# Patient Record
Sex: Female | Born: 1965 | Race: White | Hispanic: No | Marital: Single | Smoking: Former smoker
Health system: Southern US, Community
[De-identification: ages and names within clinical notes are randomized; demographics above are authoritative.]

## PROBLEM LIST (undated history)

## (undated) DIAGNOSIS — G43909 Migraine, unspecified, not intractable, without status migrainosus: Secondary | ICD-10-CM

## (undated) DIAGNOSIS — F419 Anxiety disorder, unspecified: Secondary | ICD-10-CM

## (undated) HISTORY — PX: COLON RESECTION: SHX5231

## (undated) HISTORY — PX: RETINAL DETACHMENT SURGERY: SHX105

## (undated) HISTORY — PX: ABDOMINAL SURGERY: SHX537

## (undated) HISTORY — DX: Migraine, unspecified, not intractable, without status migrainosus: G43.909

## (undated) HISTORY — PX: EYE SURGERY: SHX253

## (undated) HISTORY — DX: Anxiety disorder, unspecified: F41.9

## (undated) HISTORY — PX: CATARACT EXTRACTION, BILATERAL: SHX1313

---

## 1991-11-23 DIAGNOSIS — G43909 Migraine, unspecified, not intractable, without status migrainosus: Secondary | ICD-10-CM | POA: Insufficient documentation

## 2003-09-23 DIAGNOSIS — Z780 Asymptomatic menopausal state: Secondary | ICD-10-CM | POA: Insufficient documentation

## 2018-07-30 DIAGNOSIS — G4700 Insomnia, unspecified: Secondary | ICD-10-CM | POA: Diagnosis not present

## 2018-07-30 DIAGNOSIS — F4321 Adjustment disorder with depressed mood: Secondary | ICD-10-CM | POA: Diagnosis not present

## 2018-08-07 DIAGNOSIS — F410 Panic disorder [episodic paroxysmal anxiety] without agoraphobia: Secondary | ICD-10-CM | POA: Diagnosis not present

## 2018-08-07 DIAGNOSIS — F411 Generalized anxiety disorder: Secondary | ICD-10-CM | POA: Diagnosis not present

## 2018-09-18 DIAGNOSIS — J342 Deviated nasal septum: Secondary | ICD-10-CM | POA: Insufficient documentation

## 2018-09-18 DIAGNOSIS — J3489 Other specified disorders of nose and nasal sinuses: Secondary | ICD-10-CM | POA: Diagnosis not present

## 2018-09-18 DIAGNOSIS — J310 Chronic rhinitis: Secondary | ICD-10-CM | POA: Diagnosis not present

## 2018-09-18 DIAGNOSIS — T485X5A Adverse effect of other anti-common-cold drugs, initial encounter: Secondary | ICD-10-CM | POA: Diagnosis not present

## 2018-09-18 DIAGNOSIS — M950 Acquired deformity of nose: Secondary | ICD-10-CM | POA: Insufficient documentation

## 2018-09-24 DIAGNOSIS — W57XXXA Bitten or stung by nonvenomous insect and other nonvenomous arthropods, initial encounter: Secondary | ICD-10-CM | POA: Diagnosis not present

## 2018-09-24 DIAGNOSIS — S30860A Insect bite (nonvenomous) of lower back and pelvis, initial encounter: Secondary | ICD-10-CM | POA: Diagnosis not present

## 2018-10-09 DIAGNOSIS — G43909 Migraine, unspecified, not intractable, without status migrainosus: Secondary | ICD-10-CM | POA: Diagnosis not present

## 2018-10-21 DIAGNOSIS — K645 Perianal venous thrombosis: Secondary | ICD-10-CM | POA: Diagnosis not present

## 2018-10-25 DIAGNOSIS — F411 Generalized anxiety disorder: Secondary | ICD-10-CM | POA: Diagnosis not present

## 2018-10-25 DIAGNOSIS — F410 Panic disorder [episodic paroxysmal anxiety] without agoraphobia: Secondary | ICD-10-CM | POA: Diagnosis not present

## 2018-12-27 DIAGNOSIS — R197 Diarrhea, unspecified: Secondary | ICD-10-CM | POA: Diagnosis not present

## 2018-12-27 DIAGNOSIS — A0472 Enterocolitis due to Clostridium difficile, not specified as recurrent: Secondary | ICD-10-CM | POA: Diagnosis not present

## 2018-12-27 DIAGNOSIS — R109 Unspecified abdominal pain: Secondary | ICD-10-CM | POA: Diagnosis not present

## 2018-12-27 DIAGNOSIS — Z6822 Body mass index (BMI) 22.0-22.9, adult: Secondary | ICD-10-CM | POA: Diagnosis not present

## 2018-12-27 DIAGNOSIS — A044 Other intestinal Escherichia coli infections: Secondary | ICD-10-CM | POA: Diagnosis not present

## 2018-12-30 DIAGNOSIS — A044 Other intestinal Escherichia coli infections: Secondary | ICD-10-CM | POA: Diagnosis not present

## 2018-12-30 DIAGNOSIS — R197 Diarrhea, unspecified: Secondary | ICD-10-CM | POA: Diagnosis not present

## 2018-12-30 DIAGNOSIS — R109 Unspecified abdominal pain: Secondary | ICD-10-CM | POA: Diagnosis not present

## 2018-12-30 DIAGNOSIS — A0472 Enterocolitis due to Clostridium difficile, not specified as recurrent: Secondary | ICD-10-CM | POA: Diagnosis not present

## 2019-01-07 DIAGNOSIS — M25511 Pain in right shoulder: Secondary | ICD-10-CM | POA: Diagnosis not present

## 2019-01-07 DIAGNOSIS — R748 Abnormal levels of other serum enzymes: Secondary | ICD-10-CM | POA: Diagnosis not present

## 2019-01-07 DIAGNOSIS — Z6822 Body mass index (BMI) 22.0-22.9, adult: Secondary | ICD-10-CM | POA: Diagnosis not present

## 2019-01-07 DIAGNOSIS — Z23 Encounter for immunization: Secondary | ICD-10-CM | POA: Diagnosis not present

## 2019-01-09 DIAGNOSIS — M25511 Pain in right shoulder: Secondary | ICD-10-CM | POA: Diagnosis not present

## 2019-01-09 DIAGNOSIS — M6281 Muscle weakness (generalized): Secondary | ICD-10-CM | POA: Diagnosis not present

## 2019-01-22 DIAGNOSIS — M6281 Muscle weakness (generalized): Secondary | ICD-10-CM | POA: Diagnosis not present

## 2019-01-22 DIAGNOSIS — M25511 Pain in right shoulder: Secondary | ICD-10-CM | POA: Diagnosis not present

## 2019-01-23 DIAGNOSIS — M7551 Bursitis of right shoulder: Secondary | ICD-10-CM | POA: Diagnosis not present

## 2019-02-07 DIAGNOSIS — F4312 Post-traumatic stress disorder, chronic: Secondary | ICD-10-CM | POA: Diagnosis not present

## 2019-02-07 DIAGNOSIS — F410 Panic disorder [episodic paroxysmal anxiety] without agoraphobia: Secondary | ICD-10-CM | POA: Diagnosis not present

## 2019-02-07 DIAGNOSIS — F332 Major depressive disorder, recurrent severe without psychotic features: Secondary | ICD-10-CM | POA: Diagnosis not present

## 2019-02-07 DIAGNOSIS — F411 Generalized anxiety disorder: Secondary | ICD-10-CM | POA: Diagnosis not present

## 2019-02-13 DIAGNOSIS — M545 Low back pain: Secondary | ICD-10-CM | POA: Diagnosis not present

## 2019-02-13 DIAGNOSIS — M7541 Impingement syndrome of right shoulder: Secondary | ICD-10-CM | POA: Diagnosis not present

## 2019-04-17 DIAGNOSIS — R509 Fever, unspecified: Secondary | ICD-10-CM | POA: Diagnosis not present

## 2019-04-17 DIAGNOSIS — R05 Cough: Secondary | ICD-10-CM | POA: Diagnosis not present

## 2019-04-17 DIAGNOSIS — Z20828 Contact with and (suspected) exposure to other viral communicable diseases: Secondary | ICD-10-CM | POA: Diagnosis not present

## 2019-05-02 DIAGNOSIS — F411 Generalized anxiety disorder: Secondary | ICD-10-CM | POA: Diagnosis not present

## 2019-05-02 DIAGNOSIS — F410 Panic disorder [episodic paroxysmal anxiety] without agoraphobia: Secondary | ICD-10-CM | POA: Diagnosis not present

## 2019-05-21 DIAGNOSIS — R0989 Other specified symptoms and signs involving the circulatory and respiratory systems: Secondary | ICD-10-CM | POA: Diagnosis not present

## 2019-05-21 DIAGNOSIS — R05 Cough: Secondary | ICD-10-CM | POA: Diagnosis not present

## 2019-05-21 DIAGNOSIS — R0602 Shortness of breath: Secondary | ICD-10-CM | POA: Diagnosis not present

## 2019-05-21 DIAGNOSIS — R197 Diarrhea, unspecified: Secondary | ICD-10-CM | POA: Diagnosis not present

## 2019-07-22 DIAGNOSIS — F410 Panic disorder [episodic paroxysmal anxiety] without agoraphobia: Secondary | ICD-10-CM | POA: Diagnosis not present

## 2019-07-22 DIAGNOSIS — F411 Generalized anxiety disorder: Secondary | ICD-10-CM | POA: Diagnosis not present

## 2019-08-22 ENCOUNTER — Other Ambulatory Visit: Payer: Self-pay

## 2019-08-22 ENCOUNTER — Ambulatory Visit
Admission: EM | Admit: 2019-08-22 | Discharge: 2019-08-22 | Disposition: A | Discharge status: Home / Self Care | Payer: BC Managed Care – PPO

## 2019-08-22 DIAGNOSIS — M25511 Pain in right shoulder: Secondary | ICD-10-CM

## 2019-08-22 DIAGNOSIS — M5441 Lumbago with sciatica, right side: Secondary | ICD-10-CM

## 2019-08-22 MED ORDER — TIZANIDINE HCL 2 MG PO TABS: 2.0000 mg | ORAL_TABLET | Freq: Four times a day (QID) | ORAL | 0 refills | Status: DC | PRN

## 2019-08-22 MED ORDER — MELOXICAM 15 MG PO TABS
15.0000 mg | ORAL_TABLET | Freq: Every day | ORAL | 0 refills | Status: DC
Start: 2019-08-22 — End: 2020-06-21
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
## 2019-08-22 NOTE — Discharge Instructions (Signed)
Declines toradol and steroid shot in office Would like to avoid prednisone   Continue conservative management of rest, ice, and gentle stretches Take mobic as needed for pain relief (may cause abdominal discomfort, ulcers, and GI bleeds avoid taking with other NSAIDs) Take zanaflex at nighttime for symptomatic relief. Avoid driving or operating heavy machinery while using medication. Follow up with PCP for further evaluation and management Return or go to the ER if you have any new or worsening symptoms (fever, chills, chest pain, abdominal pain, changes in bowel or bladder habits, pain radiating into lower legs, etc...)

## 2019-09-09 ENCOUNTER — Encounter (HOSPITAL_COMMUNITY): Payer: Self-pay | Admitting: Emergency Medicine

## 2019-09-09 ENCOUNTER — Other Ambulatory Visit: Payer: Self-pay

## 2019-09-09 ENCOUNTER — Emergency Department (HOSPITAL_COMMUNITY): Payer: BC Managed Care – PPO

## 2019-09-09 ENCOUNTER — Emergency Department (HOSPITAL_COMMUNITY)
Admission: EM | Admit: 2019-09-09 | Discharge: 2019-09-09 | Disposition: A | Discharge status: Home / Self Care | Payer: BC Managed Care – PPO | Attending: Emergency Medicine | Admitting: Emergency Medicine

## 2019-09-09 DIAGNOSIS — K5900 Constipation, unspecified: Secondary | ICD-10-CM | POA: Diagnosis not present

## 2019-09-09 DIAGNOSIS — Z79899 Other long term (current) drug therapy: Secondary | ICD-10-CM | POA: Insufficient documentation

## 2019-09-09 DIAGNOSIS — Z87891 Personal history of nicotine dependence: Secondary | ICD-10-CM | POA: Diagnosis not present

## 2019-09-09 DIAGNOSIS — R1032 Left lower quadrant pain: Secondary | ICD-10-CM | POA: Diagnosis not present

## 2019-09-09 DIAGNOSIS — R1110 Vomiting, unspecified: Secondary | ICD-10-CM | POA: Diagnosis not present

## 2019-09-09 LAB — COMPREHENSIVE METABOLIC PANEL
ALT: 31 U/L (ref 0–44)
AST: 29 U/L (ref 15–41)
Albumin: 4.5 g/dL (ref 3.5–5.0)
Alkaline Phosphatase: 72 U/L (ref 38–126)
Anion gap: 11 (ref 5–15)
BUN: 18 mg/dL (ref 6–20)
CO2: 27 mmol/L (ref 22–32)
Calcium: 9.2 mg/dL (ref 8.9–10.3)
Chloride: 100 mmol/L (ref 98–111)
Creatinine, Ser: 0.7 mg/dL (ref 0.44–1.00)
GFR calc Af Amer: >60 mL/min (ref >60)
GFR calc non Af Amer: >60 mL/min (ref >60)
Glucose, Bld: 131 mg/dL — ABNORMAL HIGH (ref 70–99)
Potassium: 3.5 mmol/L (ref 3.5–5.1)
Sodium: 138 mmol/L (ref 135–145)
Total Bilirubin: 0.5 mg/dL (ref 0.3–1.2)
Total Protein: 7.5 g/dL (ref 6.5–8.1)

## 2019-09-09 LAB — URINALYSIS, ROUTINE W REFLEX MICROSCOPIC
Bacteria, UA: NONE SEEN
Bilirubin Urine: NEGATIVE
Glucose, UA: NEGATIVE mg/dL
Ketones, ur: 20 mg/dL — AB
Leukocytes,Ua: NEGATIVE
Nitrite: NEGATIVE
Protein, ur: NEGATIVE mg/dL
Specific Gravity, Urine: 1.025 (ref 1.005–1.030)
pH: 6 (ref 5.0–8.0)

## 2019-09-09 LAB — LIPASE, BLOOD: Lipase: 24 U/L (ref 11–51)

## 2019-09-09 LAB — CBC
HCT: 39.1 % (ref 36.0–46.0)
Hemoglobin: 12.6 g/dL (ref 12.0–15.0)
MCH: 26.5 pg (ref 26.0–34.0)
MCHC: 32.2 g/dL (ref 30.0–36.0)
MCV: 82.1 fL (ref 80.0–100.0)
Platelets: 266 10*3/uL (ref 150–400)
RBC: 4.76 MIL/uL (ref 3.87–5.11)
RDW: 13.7 % (ref 11.5–15.5)
WBC: 6.6 10*3/uL (ref 4.0–10.5)
nRBC: 0 % (ref 0.0–0.2)

## 2019-09-09 MED ORDER — FENTANYL CITRATE (PF) 100 MCG/2ML IJ SOLN
50.0000 ug | Freq: Once | INTRAMUSCULAR | Status: AC
  Administered 2019-09-09: 50 ug via INTRAVENOUS
  Filled 2019-09-09: qty 2

## 2019-09-09 MED ORDER — PROMETHAZINE HCL 25 MG PO TABS
25.0000 mg | ORAL_TABLET | Freq: Four times a day (QID) | ORAL | 0 refills | Status: DC | PRN
Start: 2019-09-09 — End: 2020-12-24

## 2019-09-09 MED ORDER — SODIUM CHLORIDE 0.9 % IV BOLUS
1000.0000 mL | Freq: Once | INTRAVENOUS | Status: AC
  Administered 2019-09-09: 1000 mL via INTRAVENOUS

## 2019-09-09 MED ORDER — IOHEXOL 300 MG/ML  SOLN
100.0000 mL | Freq: Once | INTRAMUSCULAR | Status: AC | PRN
  Administered 2019-09-09: 100 mL via INTRAVENOUS

## 2019-09-09 MED ORDER — PROMETHAZINE HCL 25 MG/ML IJ SOLN
25.0000 mg | Freq: Once | INTRAMUSCULAR | Status: AC
  Administered 2019-09-09: 25 mg via INTRAVENOUS
  Filled 2019-09-09: qty 1

## 2019-09-09 NOTE — ED Triage Notes (Signed)
Pt c/o of abdominal pain with n/v/constipation since last Friday.

## 2019-09-09 NOTE — Discharge Instructions (Signed)
You were seen in the emergency department for left-sided abdominal pain nausea and vomiting.  He had blood work and a CAT scan of your abdomen and pelvis that did not show any serious complications.  Please increase fiber and try a laxative.  Return to the emergency department if any worsening or concerning symptoms

## 2019-09-09 NOTE — ED Provider Notes (Signed)
[facility] EMERGENCY DEPARTMENT Provider Note   CSN: [id number] Arrival date & time: 09/09/19  1009     History Chief Complaint  Patient presents with  . Abdominal Pain    Jessica Whitaker is a 54 y.o. female.  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  Since then she will intermittently have pain in her left lower quadrant with bloating and nausea.  That usually responds to Phenergan and bowel rest.  She is new to the area and does not have any medication.  Worsening symptoms for about 5 days.  Associated with nausea.  Dark stools.  No obvious bleeding.  No fevers or chills chest pain or shortness of breath.  The history is provided by the patient.  Abdominal Pain Pain location:  LLQ Pain quality: bloating   Pain radiates to:  Does not radiate Pain severity:  Moderate Onset quality:  Gradual Timing:  Constant Progression:  Worsening Chronicity:  Recurrent Context: not recent travel, not suspicious food intake and not trauma   Relieved by:  Nothing Worsened by:  Nothing Ineffective treatments:  None tried Associated symptoms: constipation, nausea and vomiting   Associated symptoms: no chest pain, no cough, no diarrhea, no dysuria, no fever, no hematemesis, no hematochezia, no hematuria, no shortness of breath and no sore throat        History reviewed. No pertinent past medical history.  There are no problems to display for this patient.   Past Surgical History:  Procedure Laterality Date  . ABDOMINAL SURGERY    . COLON RESECTION    . EYE SURGERY       OB History   No obstetric history on file.     No family history on file.  Social History   Tobacco Use  . Smoking status: Former Research scientist (life sciences)  . Smokeless tobacco: Current User    Types: Chew  Substance Use Topics  . Alcohol use: Never  . Drug use: Never    Home Medications Prior to Admission medications   Medication Sig Start Date End Date Taking? Authorizing Provider   ALPRAZolam Duanne Moron) 0.5 MG tablet Take 0.5 mg by mouth at bedtime as needed for anxiety.    [provider]  Ascorbic Acid (VITAMIN C PO) Take by mouth.    [provider]  meloxicam (MOBIC) 15 MG tablet Take 1 tablet (15 mg total) by mouth daily. 08/22/19   Wurst, [place], PA-C  Multiple Vitamins-Minerals (ZINC PO) Take by mouth.    [provider]  Probiotic Product (PROBIOTIC-10 PO) Take by mouth.    [provider]  tiZANidine (ZANAFLEX) 2 MG tablet Take 1 tablet (2 mg total) by mouth every 6 (six) hours as needed for muscle spasms. 08/22/19   Wurst, [place], PA-C    Allergies    Levaquin [levofloxacin], Penicillins, and Lortab [hydrocodone-acetaminophen]  Review of Systems   Review of Systems  Constitutional: Negative for fever.  HENT: Negative for sore throat.   Eyes: Negative for visual disturbance.  Respiratory: Negative for cough and shortness of breath.   Cardiovascular: Negative for chest pain.  Gastrointestinal: Positive for abdominal pain, constipation, nausea and vomiting. Negative for diarrhea, hematemesis and hematochezia.  Genitourinary: Negative for dysuria and hematuria.  Musculoskeletal: Negative for neck pain.  Skin: Negative for rash.  Neurological: Negative for headaches.    Physical Exam Updated Vital Signs BP (!) 177/76   Pulse 74   Temp 98 F (36.7 C) (Oral)   Resp 17  Ht 5\' 3"  (1.6 m)   Wt 56.7 kg   SpO2 99%   BMI 22.14 kg/m   Physical Exam Vitals and nursing note reviewed.  Constitutional:      General: She is not in acute distress.    Appearance: She is well-developed.  HENT:     Head: Normocephalic and atraumatic.  Eyes:     Conjunctiva/sclera: Conjunctivae normal.  Cardiovascular:     Rate and Rhythm: Normal rate and regular rhythm.     Heart sounds: No murmur.  Pulmonary:     Effort: Pulmonary effort is normal. No respiratory distress.     Breath sounds: Normal breath sounds.  Abdominal:      Palpations: Abdomen is soft.     Tenderness: There is abdominal tenderness in the left lower quadrant. There is no guarding or rebound.  Musculoskeletal:        General: No deformity or signs of injury. Normal range of motion.     Cervical back: Neck supple.  Skin:    General: Skin is warm and dry.     Capillary Refill: Capillary refill takes less than 2 seconds.  Neurological:     General: No focal deficit present.     Mental Status: She is alert.     ED Results / Procedures / Treatments   Labs (all labs ordered are listed, but only abnormal results are displayed) Labs Reviewed  COMPREHENSIVE METABOLIC PANEL - Abnormal; Notable for the following components:      Result Value   Glucose, Bld 131 (*)    All other components within normal limits  URINALYSIS, ROUTINE W REFLEX MICROSCOPIC - Abnormal; Notable for the following components:   APPearance HAZY (*)    Hgb urine dipstick SMALL (*)    Ketones, ur 20 (*)    All other components within normal limits  LIPASE, BLOOD  CBC    EKG None  Radiology CT Abdomen Pelvis W Contrast  Result Date: 09/09/2019 CLINICAL DATA:  Abdominal pain with nausea vomiting constipation since Friday. History of colon resection. Diverticulitis suspected. EXAM: CT ABDOMEN AND PELVIS WITH CONTRAST TECHNIQUE: Multidetector CT imaging of the abdomen and pelvis was performed using the standard protocol following bolus administration of intravenous contrast. CONTRAST:  Monday OMNIPAQUE IOHEXOL 300 MG/ML  SOLN COMPARISON:  None. FINDINGS: Lower chest: Clear lung bases. Normal heart size without pericardial or pleural effusion. Hepatobiliary: Normal liver. Gallbladder mucosal hyperenhancement may be related to underdistention, including on 32/2. Relatively mild. No biliary duct dilatation. Pancreas: Normal, without mass or ductal dilatation. Spleen: Normal in size, without focal abnormality. Adrenals/Urinary Tract: Normal adrenal glands. Normal kidneys, without  hydronephrosis. Normal urinary bladder. Stomach/Bowel: Normal stomach, without wall thickening. Surgical sutures in the rectosigmoid junction. Colonic stool burden suggests constipation. The appendix is likely identified and diminutive on coronal image 55. Normal small bowel. Vascular/Lymphatic: Aortic atherosclerosis. No abdominopelvic adenopathy. Reproductive: Normal uterus and adnexa. Other: No significant free fluid. Musculoskeletal: Mild disc bulges at L4-5 and L5-S1. IMPRESSION: 1. Possible constipation. 2. No other explanation for patient's symptoms. 3. Gallbladder mucosal hyperenhancement may be related to underdistention. Correlate with right upper quadrant symptoms. If there is a clinical concern of acute cholecystitis, consider ultrasound. 4. Aortic Atherosclerosis (ICD10-I70.0). Electronically Signed   By: M.D.   On: 09/09/2019 14:50    Procedures Procedures (including critical care time)  Medications Ordered in ED Medications  sodium chloride 0.9 % bolus 1,000 mL (0 mLs Intravenous Stopped 09/09/19 1458)  promethazine (PHENERGAN) injection 25  mg (25 mg Intravenous Given 09/09/19 1152)  fentaNYL (SUBLIMAZE) injection 50 mcg (50 mcg Intravenous Given 09/09/19 1150)  iohexol (OMNIPAQUE) 300 MG/ML solution 100 mL (100 mLs Intravenous Contrast Given 09/09/19 1422)    ED Course  I have reviewed the triage vital signs and the nursing notes.  Pertinent labs & imaging results that were available during my care of the patient were reviewed by me and considered in my medical decision making (see chart for details).  Clinical Course as of Sep 08 1905  Tue Sep 09, 2019  1905 BUN: 18 [MB]    Clinical Course User Index [MB] Terrilee Files, MD   MDM Rules/Calculators/A&P                      This patient complains of left lower quadrant abdominal pain nausea vomiting; this involves an extensive number of treatment Options and is a complaint that carries with it a high risk of  complications and Morbidity. The differential includes colitis, diverticulitis, obstruction, perforation, constipation  I ordered, reviewed and interpreted labs, which included CBC with normal white count, chemistries normal other than mildly elevated glucose, urinalysis without signs of infection, does have some ketones consistent with dehydration I ordered medication pain medication fluids Phenergan I ordered imaging studies which included CT abdomen and pelvis and I independently    visualized and interpreted imaging which showed no acute findings Previous records obtained and reviewed in epic/none  After the interventions stated above, I reevaluated the patient and found patient controlled.  She is comfortable with managing her symptoms and asking for prescription for some nausea medication.  Return instructions discussed  Final Clinical Impression(s) / ED Diagnoses Final diagnoses:  LLQ pain  Constipation, unspecified constipation type    Rx / DC Orders ED Discharge Orders         Ordered    promethazine (PHENERGAN) 25 MG tablet  Every 6 hours PRN     09/09/19 1457           Terrilee Files, MD 09/09/19 1907

## 2019-11-19 DIAGNOSIS — F410 Panic disorder [episodic paroxysmal anxiety] without agoraphobia: Secondary | ICD-10-CM | POA: Diagnosis not present

## 2019-11-19 DIAGNOSIS — F411 Generalized anxiety disorder: Secondary | ICD-10-CM | POA: Diagnosis not present

## 2019-11-23 ENCOUNTER — Other Ambulatory Visit: Payer: Self-pay

## 2019-11-23 ENCOUNTER — Emergency Department (HOSPITAL_COMMUNITY)
Admission: EM | Admit: 2019-11-23 | Discharge: 2019-11-23 | Disposition: A | Discharge status: Home / Self Care | Payer: BC Managed Care – PPO | Attending: Emergency Medicine | Admitting: Emergency Medicine

## 2019-11-23 ENCOUNTER — Encounter (HOSPITAL_COMMUNITY): Payer: Self-pay | Admitting: Emergency Medicine

## 2019-11-23 DIAGNOSIS — G43809 Other migraine, not intractable, without status migrainosus: Secondary | ICD-10-CM

## 2019-11-23 DIAGNOSIS — Z87891 Personal history of nicotine dependence: Secondary | ICD-10-CM | POA: Diagnosis not present

## 2019-11-23 DIAGNOSIS — G43909 Migraine, unspecified, not intractable, without status migrainosus: Secondary | ICD-10-CM | POA: Diagnosis not present

## 2019-11-23 DIAGNOSIS — R519 Headache, unspecified: Secondary | ICD-10-CM | POA: Insufficient documentation

## 2019-11-23 MED ORDER — KETOROLAC TROMETHAMINE 30 MG/ML IJ SOLN
30.0000 mg | Freq: Once | INTRAMUSCULAR | Status: DC
  Filled 2019-11-23: qty 1

## 2019-11-23 MED ORDER — SODIUM CHLORIDE 0.9 % IV BOLUS: 1000.0000 mL | Freq: Once | INTRAVENOUS | Status: DC

## 2019-11-23 MED ORDER — DIPHENHYDRAMINE HCL 50 MG/ML IJ SOLN
25.0000 mg | Freq: Once | INTRAMUSCULAR | Status: DC
  Filled 2019-11-23: qty 1

## 2019-11-23 MED ORDER — DEXAMETHASONE SODIUM PHOSPHATE 10 MG/ML IJ SOLN
10.0000 mg | Freq: Once | INTRAMUSCULAR | Status: DC
  Filled 2019-11-23: qty 1

## 2019-11-23 MED ORDER — METOCLOPRAMIDE HCL 5 MG/ML IJ SOLN
10.0000 mg | Freq: Once | INTRAMUSCULAR | Status: DC
  Filled 2019-11-23: qty 2
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
## 2019-11-23 NOTE — ED Notes (Signed)
Pt refused all meds and bolus of fluids. Stated " none of that is going to work. I really dont want it. I want to go home" MD made aware. Pt walked out of facility with D/c papers

## 2019-11-23 NOTE — Discharge Instructions (Addendum)
Continue medications as prescribed.  Return to the emergency department if you would like the medications recommended to you, or if your symptoms significantly worsen or change.

## 2019-11-23 NOTE — ED Triage Notes (Signed)
Migraine started last night at 2030.  Took medication prescribed for migraine without relief. NIHSS negative in triage.

## 2020-02-03 ENCOUNTER — Other Ambulatory Visit: Payer: BC Managed Care – PPO

## 2020-02-11 DIAGNOSIS — F410 Panic disorder [episodic paroxysmal anxiety] without agoraphobia: Secondary | ICD-10-CM | POA: Diagnosis not present

## 2020-02-11 DIAGNOSIS — F411 Generalized anxiety disorder: Secondary | ICD-10-CM | POA: Diagnosis not present

## 2020-04-23 ENCOUNTER — Other Ambulatory Visit: Payer: BC Managed Care – PPO

## 2020-04-23 DIAGNOSIS — Z20822 Contact with and (suspected) exposure to covid-19: Secondary | ICD-10-CM

## 2020-04-27 LAB — NOVEL CORONAVIRUS, NAA: SARS-CoV-2, NAA: NOT DETECTED

## 2020-05-04 DIAGNOSIS — F410 Panic disorder [episodic paroxysmal anxiety] without agoraphobia: Secondary | ICD-10-CM | POA: Diagnosis not present

## 2020-05-04 DIAGNOSIS — F411 Generalized anxiety disorder: Secondary | ICD-10-CM | POA: Diagnosis not present

## 2020-05-04 DIAGNOSIS — F4323 Adjustment disorder with mixed anxiety and depressed mood: Secondary | ICD-10-CM | POA: Diagnosis not present

## 2020-06-13 IMAGING — CT CT ABD-PELV W/ CM
2 of 5 series · 16 of 46 positions shown, 18 images · IV contrast (Omnipaque or Isovue)
Comparison: None.

CLINICAL DATA: Abdominal pain with nausea vomiting constipation
since [REDACTED]. History of colon resection. Diverticulitis suspected.

EXAM:
CT ABDOMEN AND PELVIS WITH CONTRAST
TECHNIQUE: Multidetector CT imaging of the abdomen and pelvis was performed
using the standard protocol following bolus administration of
intravenous contrast.
CONTRAST:  100mL OMNIPAQUE IOHEXOL 300 MG/ML  SOLN

[Series 2: axial st · axial · 0.61mm/px · z∈[-656,-271]mm · 13 of 87 slices shown, 15 images]
[im 5/87  soft-tissue]
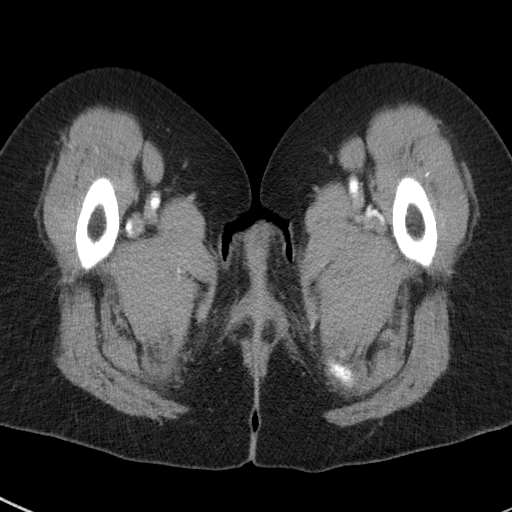
[im 5/87  bone]
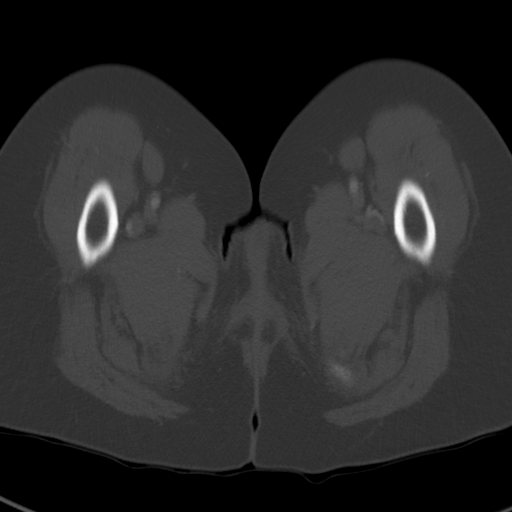
[im 10/87  soft-tissue]
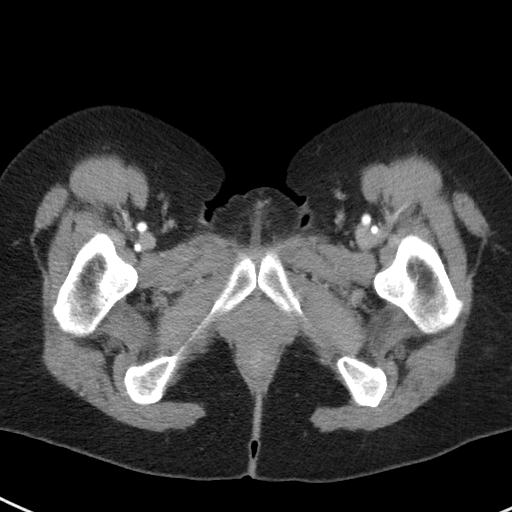
[im 20/87  soft-tissue]
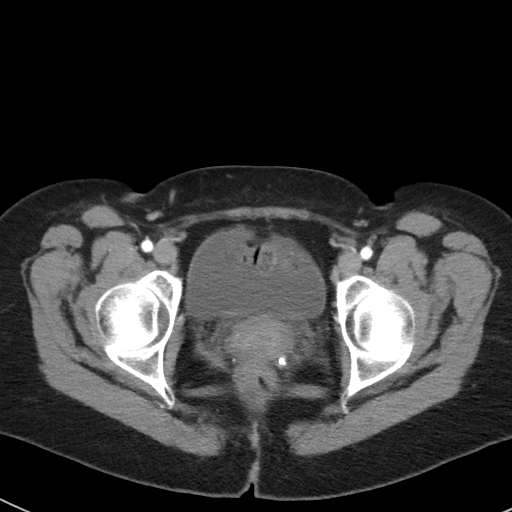
[im 24/87  soft-tissue]
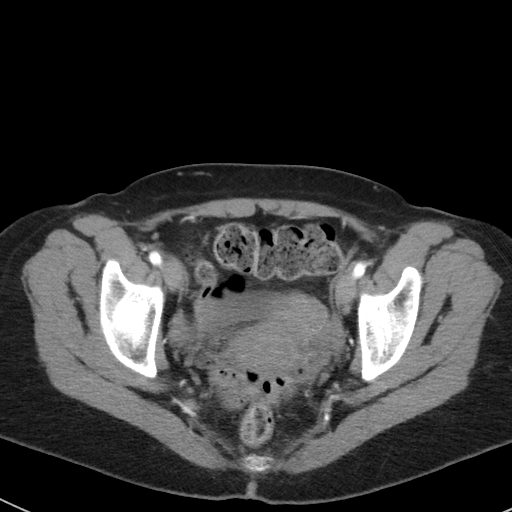
[im 29/87  soft-tissue]
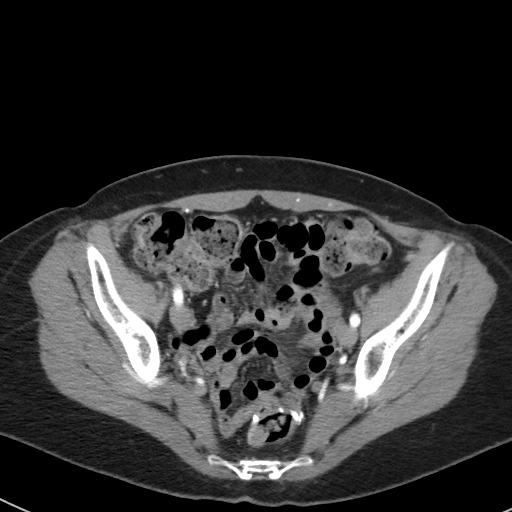
[im 39/87  soft-tissue]
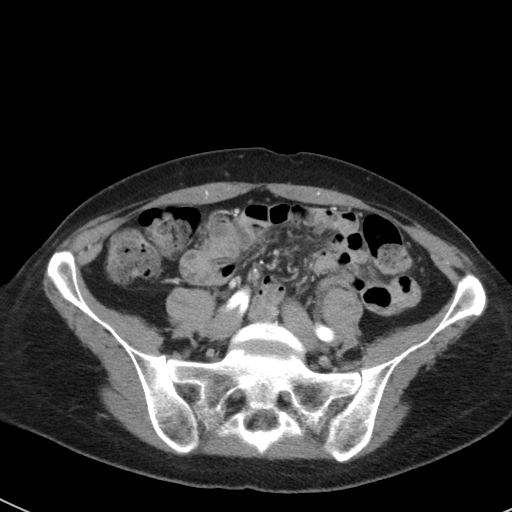
[im 44/87  soft-tissue]
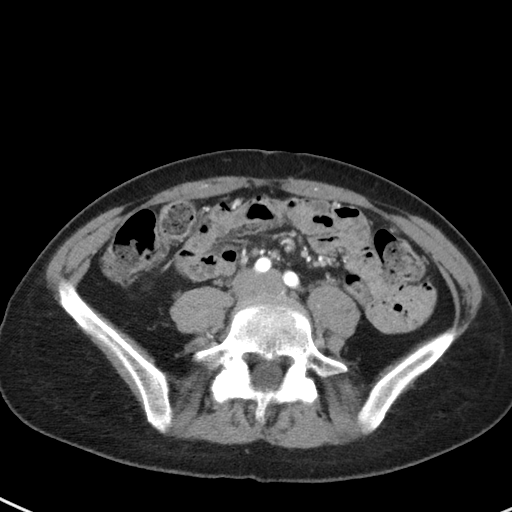
[im 48/87  soft-tissue]
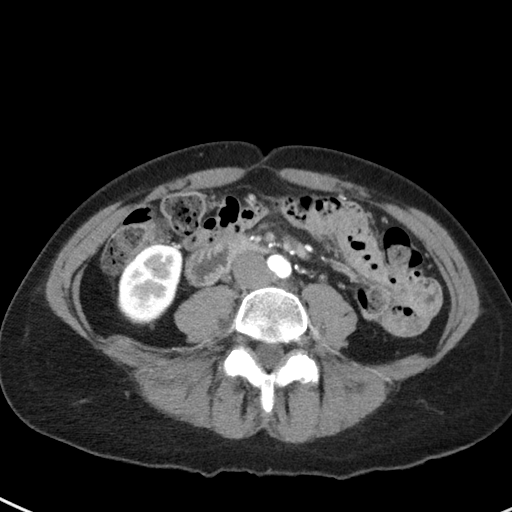
[im 58/87  soft-tissue]
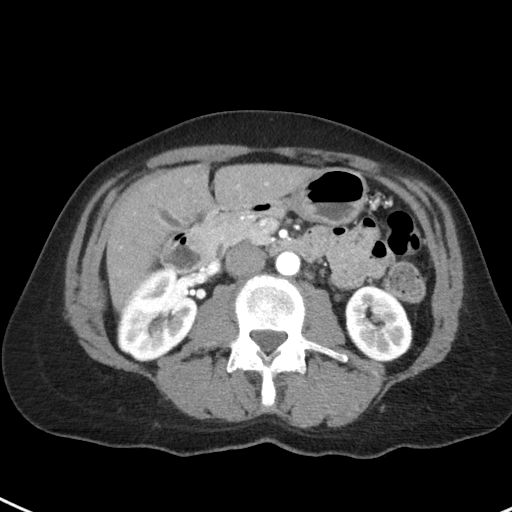
[im 58/87  bone]
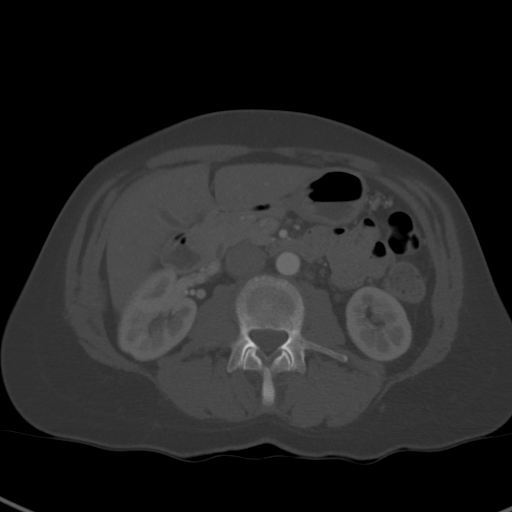
[im 63/87  soft-tissue]
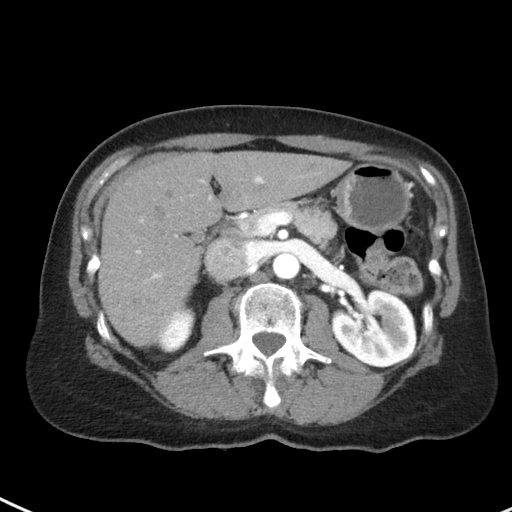
[im 67/87  soft-tissue]
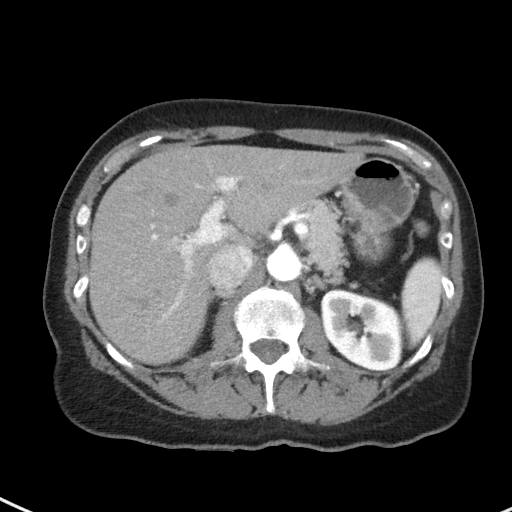
[im 77/87  soft-tissue]
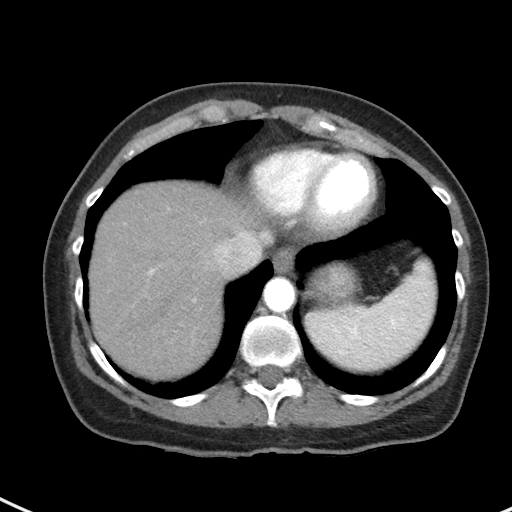
[im 82/87  soft-tissue]
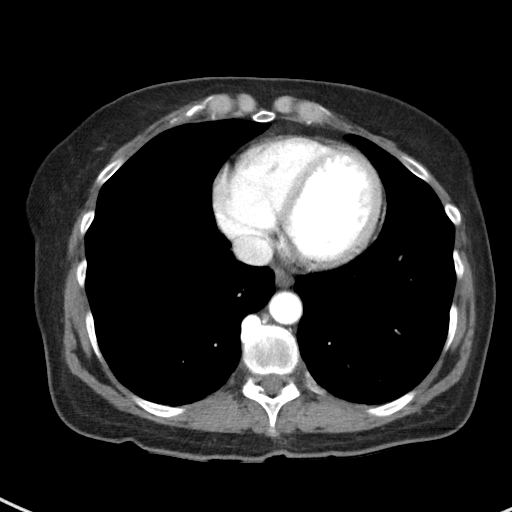

[Series 6: coronal st · coronal · 0.62mm/px · 3 of 117 slices shown]
[im 39/117  soft-tissue]
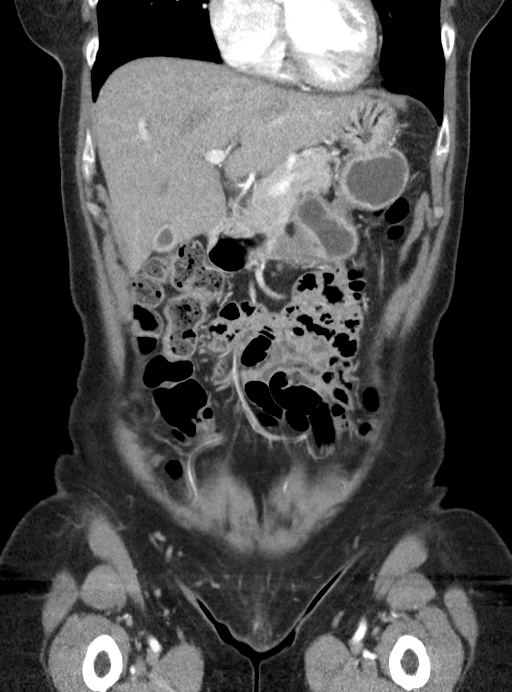
[im 52/117  soft-tissue]
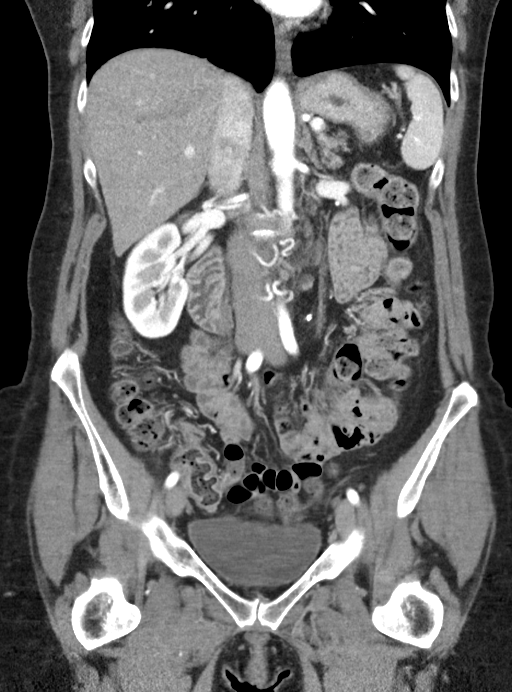
[im 65/117  soft-tissue]
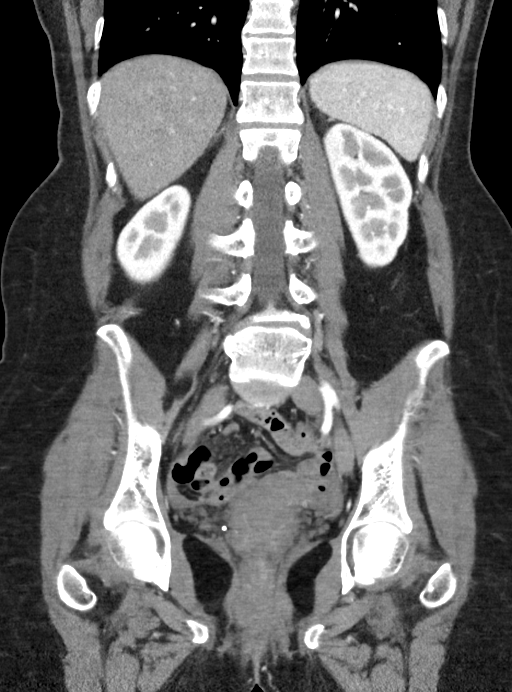

[16 of 46 positions shown; findings below may reference images not displayed]

FINDINGS: Lower chest: Clear lung bases. Normal heart size without pericardial
or pleural effusion.

Hepatobiliary: Normal liver. Gallbladder mucosal hyperenhancement
may be related to underdistention, including on 32/2. Relatively
mild. No biliary duct dilatation.

Pancreas: Normal, without mass or ductal dilatation.

Spleen: Normal in size, without focal abnormality.

Adrenals/Urinary Tract: Normal adrenal glands. Normal kidneys,
without hydronephrosis. Normal urinary bladder.

Stomach/Bowel: Normal stomach, without wall thickening. Surgical
sutures in the rectosigmoid junction. Colonic stool burden suggests
constipation. The appendix is likely identified and diminutive on
coronal image 55. Normal small bowel.

Vascular/Lymphatic: Aortic atherosclerosis. No abdominopelvic
adenopathy.

Reproductive: Normal uterus and adnexa.

Other: No significant free fluid.

Musculoskeletal: Mild disc bulges at L4-5 and L5-S1.
IMPRESSION: 1. Possible constipation.
2. No other explanation for patient's symptoms.
3. Gallbladder mucosal hyperenhancement may be related to
underdistention. Correlate with right upper quadrant symptoms. If
there is a clinical concern of acute cholecystitis, consider
ultrasound.
4. Aortic Atherosclerosis ([id number]).

## 2020-06-21 ENCOUNTER — Other Ambulatory Visit: Payer: Self-pay

## 2020-06-21 ENCOUNTER — Ambulatory Visit: Payer: BC Managed Care – PPO | Admitting: Nurse Practitioner

## 2020-06-21 ENCOUNTER — Encounter: Payer: Self-pay | Admitting: Nurse Practitioner

## 2020-06-21 VITALS — BP 114/62 | HR 83 | Temp 97.5°F | Ht 61.4 in | Wt 128.0 lb

## 2020-06-21 DIAGNOSIS — Z13228 Encounter for screening for other metabolic disorders: Secondary | ICD-10-CM | POA: Diagnosis not present

## 2020-06-21 DIAGNOSIS — Z1159 Encounter for screening for other viral diseases: Secondary | ICD-10-CM

## 2020-06-21 DIAGNOSIS — Z9889 Other specified postprocedural states: Secondary | ICD-10-CM

## 2020-06-21 DIAGNOSIS — Z23 Encounter for immunization: Secondary | ICD-10-CM

## 2020-06-21 DIAGNOSIS — G4709 Other insomnia: Secondary | ICD-10-CM

## 2020-06-21 DIAGNOSIS — Z01419 Encounter for gynecological examination (general) (routine) without abnormal findings: Secondary | ICD-10-CM

## 2020-06-21 DIAGNOSIS — G43109 Migraine with aura, not intractable, without status migrainosus: Secondary | ICD-10-CM

## 2020-06-21 DIAGNOSIS — Z720 Tobacco use: Secondary | ICD-10-CM

## 2020-06-21 DIAGNOSIS — Z1231 Encounter for screening mammogram for malignant neoplasm of breast: Secondary | ICD-10-CM

## 2020-06-21 DIAGNOSIS — Z7689 Persons encountering health services in other specified circumstances: Secondary | ICD-10-CM

## 2020-06-21 MED ORDER — SUMATRIPTAN SUCCINATE 50 MG PO TABS: 50.0000 mg | ORAL_TABLET | ORAL | 3 refills | Status: DC | PRN

## 2020-06-21 MED ORDER — IBUPROFEN 800 MG PO TABS: 800.0000 mg | ORAL_TABLET | Freq: Three times a day (TID) | ORAL | 0 refills | Status: AC | PRN

## 2020-06-21 MED ORDER — SHINGRIX 50 MCG/0.5ML IM SUSR: 0.5000 mL | Freq: Once | INTRAMUSCULAR | 0 refills | Status: AC
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
## 2020-06-22 LAB — LIPID PANEL
Chol/HDL Ratio: 3.8 ratio (ref 0.0–4.4)
Cholesterol, Total: 299 mg/dL — ABNORMAL HIGH (ref 100–199)
HDL: 79 mg/dL (ref >39)
LDL Chol Calc (NIH): 198 mg/dL — ABNORMAL HIGH (ref 0–99)
Triglycerides: 127 mg/dL (ref 0–149)
VLDL Cholesterol Cal: 22 mg/dL (ref 5–40)

## 2020-06-22 LAB — CMP14+EGFR
ALT: 20 IU/L (ref 0–32)
AST: 19 IU/L (ref 0–40)
Albumin/Globulin Ratio: 2 (ref 1.2–2.2)
Albumin: 4.9 g/dL (ref 3.8–4.9)
Alkaline Phosphatase: 89 IU/L (ref 44–121)
BUN/Creatinine Ratio: 15 (ref 9–23)
BUN: 13 mg/dL (ref 6–24)
Bilirubin Total: 0.2 mg/dL (ref 0.0–1.2)
CO2: 24 mmol/L (ref 20–29)
Calcium: 9.6 mg/dL (ref 8.7–10.2)
Chloride: 99 mmol/L (ref 96–106)
Creatinine, Ser: 0.85 mg/dL (ref 0.57–1.00)
Globulin, Total: 2.5 g/dL (ref 1.5–4.5)
Glucose: 95 mg/dL (ref 65–99)
Potassium: 4.1 mmol/L (ref 3.5–5.2)
Sodium: 139 mmol/L (ref 134–144)
Total Protein: 7.4 g/dL (ref 6.0–8.5)
eGFR: 81 mL/min/{1.73_m2} (ref >59)

## 2020-06-22 LAB — HEPATITIS C ANTIBODY: Hep C Virus Ab: <0.1 s/co ratio (ref 0.0–0.9)

## 2020-06-22 LAB — CBC
Hematocrit: 39.6 % (ref 34.0–46.6)
Hemoglobin: 12.6 g/dL (ref 11.1–15.9)
MCH: 25.8 pg — ABNORMAL LOW (ref 26.6–33.0)
MCHC: 31.8 g/dL (ref 31.5–35.7)
MCV: 81 fL (ref 79–97)
Platelets: 282 10*3/uL (ref 150–450)
RBC: 4.89 x10E6/uL (ref 3.77–5.28)
RDW: 13.3 % (ref 11.7–15.4)
WBC: 7.8 10*3/uL (ref 3.4–10.8)

## 2020-06-29 ENCOUNTER — Encounter: Payer: Self-pay | Admitting: Nurse Practitioner

## 2020-07-03 ENCOUNTER — Inpatient Hospital Stay: Admission: RE | Admit: 2020-07-03 | Payer: BC Managed Care – PPO | Source: Ambulatory Visit

## 2020-07-17 ENCOUNTER — Inpatient Hospital Stay: Admission: RE | Admit: 2020-07-17 | Payer: BC Managed Care – PPO | Source: Ambulatory Visit

## 2020-07-28 DIAGNOSIS — F411 Generalized anxiety disorder: Secondary | ICD-10-CM | POA: Diagnosis not present

## 2020-07-28 DIAGNOSIS — F4323 Adjustment disorder with mixed anxiety and depressed mood: Secondary | ICD-10-CM | POA: Diagnosis not present

## 2020-07-28 DIAGNOSIS — F410 Panic disorder [episodic paroxysmal anxiety] without agoraphobia: Secondary | ICD-10-CM | POA: Diagnosis not present

## 2020-08-13 ENCOUNTER — Encounter: Payer: BC Managed Care – PPO | Admitting: Medical

## 2020-08-25 ENCOUNTER — Telehealth: Payer: Self-pay | Admitting: *Deleted

## 2020-08-25 ENCOUNTER — Encounter: Payer: Self-pay | Admitting: Neurology

## 2020-08-25 ENCOUNTER — Ambulatory Visit: Payer: BC Managed Care – PPO | Admitting: Neurology

## 2020-08-25 ENCOUNTER — Telehealth: Payer: Self-pay | Admitting: Neurology

## 2020-08-25 NOTE — Telephone Encounter (Signed)
Patient no showed new patient appointment. This is her first no show at [facility].
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
## 2020-08-25 NOTE — Telephone Encounter (Signed)
Patient no showed new appointment in neurology.  If she calls back she can be scheduled with any provider however remind her know that we have many people waiting for appointments and also that if she has another no-show or cancellation within 24 hours she may be dismissed per office policy.

## 2020-09-01 ENCOUNTER — Encounter (HOSPITAL_COMMUNITY): Payer: Self-pay | Admitting: *Deleted

## 2020-09-01 ENCOUNTER — Other Ambulatory Visit: Payer: Self-pay

## 2020-09-01 ENCOUNTER — Ambulatory Visit (HOSPITAL_COMMUNITY)
Admission: EM | Admit: 2020-09-01 | Discharge: 2020-09-01 | Disposition: A | Discharge status: Home / Self Care | Payer: BC Managed Care – PPO | Attending: Medical Oncology | Admitting: Medical Oncology

## 2020-09-01 ENCOUNTER — Ambulatory Visit: Payer: Self-pay

## 2020-09-01 DIAGNOSIS — L249 Irritant contact dermatitis, unspecified cause: Secondary | ICD-10-CM

## 2020-09-01 MED ORDER — TRIAMCINOLONE ACETONIDE 0.1 % EX CREA: 1.0000 "application " | TOPICAL_CREAM | Freq: Two times a day (BID) | CUTANEOUS | 0 refills | Status: DC

## 2020-09-01 NOTE — ED Provider Notes (Signed)
[facility]    CSN: [id number] Arrival date & time: 09/01/20  1258      History   Chief Complaint Chief Complaint  Patient presents with  . Insect Bite    HPI Jessica Whitaker is a 55 y.o. female.   HPI   Skin irritation: Patient states that on Saturday she awoke to an itchy red rash on her left breast.  She states that she had had many spiders found in her house that day and questions if she may have gotten bitten by a spider or another insect.  She has not had any skin ulcer, skin discharge, nipple discharge or breast pain.  She also has not had any fevers or cold symptoms.  She has tried taking Benadryl which has not significantly helped her discomfort and itching of this area.  No reported history of MRSA.  Past Medical History:  Diagnosis Date  . Anxiety   . Migraine     There are no problems to display for this patient.   Past Surgical History:  Procedure Laterality Date  . ABDOMINAL SURGERY    . COLON RESECTION    . COLON RESECTION    . EYE SURGERY      OB History   No obstetric history on file.      Home Medications    Prior to Admission medications   Medication Sig Start Date End Date Taking? Authorizing Provider  triamcinolone cream (KENALOG) 0.1 % Apply 1 application topically 2 (two) times daily. For up to 2 weeks 09/01/20  Yes Karyss Frese, Maralyn Sago M, PA-C  ALPRAZolam Prudy Feeler) 0.5 MG tablet Take 0.5 mg by mouth at bedtime as needed for anxiety.    [provider]  Ascorbic Acid (VITAMIN C PO) Take by mouth.    [provider]  cetirizine (ZYRTEC) 10 MG tablet Take 10 mg by mouth daily.    [provider]  cholecalciferol (VITAMIN D3) 25 MCG (1000 UNIT) tablet Take 1,000 Units by mouth daily.    [provider]  famotidine (PEPCID) 10 MG tablet Take 10 mg by mouth 2 (two) times daily.    [provider]  ibuprofen (ADVIL) 800 MG tablet Take 1 tablet (800 mg total) by mouth every 8 (eight) hours as  needed. 06/21/20   Arnette Felts, FNP  Multiple Vitamins-Minerals (ZINC PO) Take by mouth.    [provider]  Probiotic Product (PROBIOTIC-10 PO) Take by mouth. Patient not taking: Reported on 06/21/2020    [provider]  promethazine (PHENERGAN) 25 MG tablet Take 1 tablet (25 mg total) by mouth every 6 (six) hours as needed for nausea or vomiting. Patient not taking: Reported on 06/21/2020 09/09/19   Terrilee Files, MD  SUMAtriptan (IMITREX) 50 MG tablet Take 1 tablet (50 mg total) by mouth every 2 (two) hours as needed for up to 9 doses for migraine. May repeat in 2 hours if headache persists or recurs. 06/21/20   Arnette Felts, FNP    Family History Family History  Problem Relation Age of Onset  . Healthy Mother   . Heart disease Father   . Hyperlipidemia Father   . Diabetes Father   . Cancer Father   . Dementia Father   . Cancer Maternal Grandmother   . Brain cancer Maternal Grandmother   . Lung cancer Maternal Grandmother   . Healthy Maternal Grandfather   . Cancer Paternal Grandmother   . Migraines Paternal Grandmother     Social History Social History  Tobacco Use  . Smoking status: Former Smoker    Packs/day: 1.50    Years: 30.00    Pack years: 45.00  . Smokeless tobacco: Current User  Vaping Use  . Vaping Use: Every day  . Start date: 06/16/2019  . Devices: Jouel  Substance Use Topics  . Alcohol use: Never  . Drug use: Never     Allergies   Levaquin [levofloxacin], Penicillins, and Lortab [hydrocodone-acetaminophen]   Review of Systems Review of Systems  As stated above in HPI Physical Exam Triage Vital Signs ED Triage Vitals  Enc Vitals Group     BP 09/01/20 1410 126/80     Pulse Rate 09/01/20 1410 72     Resp 09/01/20 1410 18     Temp 09/01/20 1410 98.1 F (36.7 C)     Temp src --      SpO2 09/01/20 1410 100 %     Weight --      Height --      Head Circumference --      Peak Flow --      Pain Score 09/01/20 1407 8      Pain Loc --      Pain Edu? --      Excl. in GC? --    No data found.  Updated Vital Signs BP 126/80   Pulse 72   Temp 98.1 F (36.7 C)   Resp 18   SpO2 100%   Physical Exam Vitals and nursing note reviewed.  Constitutional:      Appearance: Normal appearance.  Skin:    Comments: There is a 2 inh round slightly raised erythematic rash of the left breast at 3 o'clock which is poorly demarcated. No central clearing, open wound, nipple or other discharge, vesicles or scaling of the skin. No other rash of the body.   Neurological:     Mental Status: She is alert.      UC Treatments / Results  Labs (all labs ordered are listed, but only abnormal results are displayed) Labs Reviewed - No data to display  EKG   Radiology No results found.  Procedures Procedures (including critical care time)  Medications Ordered in UC Medications - No data to display  Initial Impression / Assessment and Plan / UC Course  I have reviewed the triage vital signs and the nursing notes.  Pertinent labs & imaging results that were available during my care of the patient were reviewed by me and considered in my medical decision making (see chart for details).     New. Likely contact irritant dermatitis. Discussed triamcinolone trial. Discussed red flag signs and symptoms. She will need to make sure to remain UTD on mammograms.  Final Clinical Impressions(s) / UC Diagnoses   Final diagnoses:  Irritant dermatitis   Discharge Instructions   None    ED Prescriptions    Medication Sig Dispense Auth. Provider   triamcinolone cream (KENALOG) 0.1 % Apply 1 application topically 2 (two) times daily. For up to 2 weeks 30 g Valla Pacey M, [place]     PDMP not reviewed this encounter.   Rushie Chestnut, [place] 09/01/20 1516

## 2020-09-01 NOTE — ED Triage Notes (Signed)
Bite insect to Lt breast near nipple . Pt reports skin is red and itches.

## 2020-09-23 DIAGNOSIS — H04123 Dry eye syndrome of bilateral lacrimal glands: Secondary | ICD-10-CM | POA: Diagnosis not present

## 2020-09-23 DIAGNOSIS — H40053 Ocular hypertension, bilateral: Secondary | ICD-10-CM | POA: Diagnosis not present

## 2020-09-23 DIAGNOSIS — Z961 Presence of intraocular lens: Secondary | ICD-10-CM | POA: Diagnosis not present

## 2020-10-21 DIAGNOSIS — S335XXA Sprain of ligaments of lumbar spine, initial encounter: Secondary | ICD-10-CM | POA: Diagnosis not present

## 2020-10-21 DIAGNOSIS — M25552 Pain in left hip: Secondary | ICD-10-CM | POA: Diagnosis not present

## 2020-10-27 DIAGNOSIS — F411 Generalized anxiety disorder: Secondary | ICD-10-CM | POA: Diagnosis not present

## 2020-10-27 DIAGNOSIS — F410 Panic disorder [episodic paroxysmal anxiety] without agoraphobia: Secondary | ICD-10-CM | POA: Diagnosis not present

## 2020-10-27 DIAGNOSIS — F4323 Adjustment disorder with mixed anxiety and depressed mood: Secondary | ICD-10-CM | POA: Diagnosis not present

## 2020-12-21 ENCOUNTER — Encounter (HOSPITAL_COMMUNITY): Payer: Self-pay | Admitting: *Deleted

## 2020-12-21 ENCOUNTER — Other Ambulatory Visit: Payer: Self-pay

## 2020-12-21 ENCOUNTER — Emergency Department (HOSPITAL_COMMUNITY)
Admission: EM | Admit: 2020-12-21 | Discharge: 2020-12-21 | Disposition: A | Discharge status: Home / Self Care | Payer: BC Managed Care – PPO | Attending: Emergency Medicine | Admitting: Emergency Medicine

## 2020-12-21 DIAGNOSIS — R519 Headache, unspecified: Secondary | ICD-10-CM | POA: Diagnosis not present

## 2020-12-21 DIAGNOSIS — G43109 Migraine with aura, not intractable, without status migrainosus: Secondary | ICD-10-CM | POA: Diagnosis not present

## 2020-12-21 DIAGNOSIS — Z87891 Personal history of nicotine dependence: Secondary | ICD-10-CM | POA: Insufficient documentation

## 2020-12-21 MED ORDER — DIPHENHYDRAMINE HCL 50 MG/ML IJ SOLN
12.5000 mg | Freq: Once | INTRAMUSCULAR | Status: AC
  Administered 2020-12-21: 12.5 mg via INTRAVENOUS
  Filled 2020-12-21: qty 1

## 2020-12-21 MED ORDER — METOCLOPRAMIDE HCL 5 MG/ML IJ SOLN
10.0000 mg | Freq: Once | INTRAMUSCULAR | Status: AC
  Administered 2020-12-21: 10 mg via INTRAVENOUS
  Filled 2020-12-21: qty 2

## 2020-12-21 MED ORDER — KETOROLAC TROMETHAMINE 30 MG/ML IJ SOLN
30.0000 mg | Freq: Once | INTRAMUSCULAR | Status: AC
  Administered 2020-12-21: 30 mg via INTRAVENOUS
  Filled 2020-12-21: qty 1

## 2020-12-21 NOTE — ED Provider Notes (Signed)
[facility] EMERGENCY DEPARTMENT Provider Note   CSN: [id number] Arrival date & time: 12/21/20  1114     History Chief Complaint  Patient presents with   Migraine    Jessica Whitaker is a 55 y.o. female.   Migraine Associated symptoms include headaches. Pertinent negatives include no chest pain and no shortness of breath.      Jessica Whitaker is a 55 y.o. female with past medical history of migraines and anxiety, who presents to the Emergency Department complaining of persistent headache x1 week.  States that she had a recent death in her family and has been stressed and crying more frequently and believes this has triggered a migraine headache.  She describes a throbbing pounding sensation to the right side of her head that radiates from the base of her skull toward her right eye.  She also endorses having an aura which is typical of her headaches.  States that she is sensitive to bright lights.  Headache is been associated with nausea and vomiting.  She typically takes Aimovig, but ran out of the medication and her insurance does not cover it.  She denies any facial numbness or weakness, difficulties with speech, numbness or weakness of the extremities, chest pain or shortness of breath.  No neck pain or stiffness.  No fever or chills.    Past Medical History:  Diagnosis Date   Anxiety    Migraine     There are no problems to display for this patient.   Past Surgical History:  Procedure Laterality Date   ABDOMINAL SURGERY     COLON RESECTION     COLON RESECTION     EYE SURGERY       OB History   No obstetric history on file.     Family History  Problem Relation Age of Onset   Healthy Mother    Heart disease Father    Hyperlipidemia Father    Diabetes Father    Cancer Father    Dementia Father    Cancer Maternal Grandmother    Brain cancer Maternal Grandmother    Lung cancer Maternal Grandmother    Healthy Maternal Grandfather    Cancer Paternal Grandmother     Migraines Paternal Grandmother     Social History   Tobacco Use   Smoking status: Former    Packs/day: 1.50    Years: 30.00    Pack years: 45.00    Types: Cigarettes   Smokeless tobacco: Current  Vaping Use   Vaping Use: Every day   Start date: 06/16/2019   Devices: Jouel  Substance Use Topics   Alcohol use: Never   Drug use: Never    Home Medications Prior to Admission medications   Medication Sig Start Date End Date Taking? Authorizing Provider  ALPRAZolam Prudy Feeler) 0.5 MG tablet Take 0.5 mg by mouth at bedtime as needed for anxiety.    [provider]  Ascorbic Acid (VITAMIN C PO) Take by mouth.    [provider]  cetirizine (ZYRTEC) 10 MG tablet Take 10 mg by mouth daily.    [provider]  cholecalciferol (VITAMIN D3) 25 MCG (1000 UNIT) tablet Take 1,000 Units by mouth daily.    [provider]  famotidine (PEPCID) 10 MG tablet Take 10 mg by mouth 2 (two) times daily.    [provider]  ibuprofen (ADVIL) 800 MG tablet Take 1 tablet (800 mg total) by mouth every 8 (eight) hours as needed. 06/21/20   Arnette Felts, FNP  Multiple Vitamins-Minerals (ZINC PO) Take by mouth.    [provider]  Probiotic Product (PROBIOTIC-10 PO) Take by mouth. Patient not taking: Reported on 06/21/2020    [provider]  promethazine (PHENERGAN) 25 MG tablet Take 1 tablet (25 mg total) by mouth every 6 (six) hours as needed for nausea or vomiting. Patient not taking: Reported on 06/21/2020 09/09/19   Terrilee Files, MD  SUMAtriptan (IMITREX) 50 MG tablet Take 1 tablet (50 mg total) by mouth every 2 (two) hours as needed for up to 9 doses for migraine. May repeat in 2 hours if headache persists or recurs. 06/21/20   Arnette Felts, FNP  triamcinolone cream (KENALOG) 0.1 % Apply 1 application topically 2 (two) times daily. For up to 2 weeks 09/01/20   Rushie Chestnut, PA-C    Allergies    Levaquin [levofloxacin], Penicillins, and  Lortab [hydrocodone-acetaminophen]  Review of Systems   Review of Systems  Constitutional:  Negative for activity change, appetite change and fever.  HENT:  Negative for congestion, facial swelling and trouble swallowing.   Eyes:  Positive for photophobia. Negative for pain and visual disturbance.  Respiratory:  Negative for shortness of breath.   Cardiovascular:  Negative for chest pain.  Gastrointestinal:  Positive for nausea and vomiting.  Musculoskeletal:  Negative for arthralgias, neck pain and neck stiffness.  Skin:  Negative for rash and wound.  Neurological:  Positive for headaches. Negative for dizziness, syncope, facial asymmetry, speech difficulty, weakness and numbness.  Hematological:  Negative for adenopathy.  Psychiatric/Behavioral:  Negative for confusion and decreased concentration.    Physical Exam Updated Vital Signs BP (!) 142/98 (BP Location: Right Arm)   Pulse 96   Temp 98.2 F (36.8 C) (Oral)   Resp 19   Ht 5\' 3"  (1.6 m)   Wt 60.3 kg   SpO2 97%   BMI 23.55 kg/m   Physical Exam Vitals and nursing note reviewed.  Constitutional:      General: She is not in acute distress.    Appearance: Normal appearance. She is not ill-appearing or toxic-appearing.     Comments: Patient is tearful on exam  HENT:     Head: Normocephalic.  Eyes:     Extraocular Movements: Extraocular movements intact.     Conjunctiva/sclera: Conjunctivae normal.     Pupils: Pupils are equal, round, and reactive to light.  Neck:     Thyroid: No thyromegaly.     Meningeal: Kernig's sign absent.  Cardiovascular:     Rate and Rhythm: Normal rate and regular rhythm.     Pulses: Normal pulses.  Pulmonary:     Effort: Pulmonary effort is normal. No respiratory distress.     Breath sounds: Normal breath sounds.  Abdominal:     Tenderness: There is no rebound.  Musculoskeletal:        General: No swelling or tenderness. Normal range of motion.     Cervical back: Full passive range of  motion without pain and normal range of motion. No tenderness.  Skin:    General: Skin is warm.     Capillary Refill: Capillary refill takes less than 2 seconds.     Findings: No rash.  Neurological:     General: No focal deficit present.     Mental Status: She is alert and oriented to person, place, and time.     GCS: GCS eye subscore is 4. GCS verbal subscore is 5. GCS motor subscore is 6.     Sensory:  Sensation is intact.     Motor: Motor function is intact.     Coordination: Coordination is intact.     Comments: Cranial nerves II through XII intact.  Speech clear, no facial droop.  No pronator drift.    ED Results / Procedures / Treatments   Labs (all labs ordered are listed, but only abnormal results are displayed) Labs Reviewed - No data to display  EKG None  Radiology No results found.  Procedures Procedures   Medications Ordered in ED Medications - No data to display  ED Course  I have reviewed the triage vital signs and the nursing notes.  Pertinent labs & imaging results that were available during my care of the patient were reviewed by me and considered in my medical decision making (see chart for details).    MDM Rules/Calculators/A&P                           Patient here for evaluation of headache x1 week.  Has history of migraines and reports current headache feels similar to previous.  Headache associated with right-sided floaters and patient states this is typical for her.  Onset gradual and after increase stressors and crying.   On exam, patient well-appearing.  Mildly hypertensive but otherwise nontoxic-appearing.  No nuchal rigidity on exam.  No focal neurodeficits.  Likely stress-induced.  No concerning symptoms to suggest meningitis or SAH.    Will try migraine cocktail and reassess.  On recheck, patient reports feeling better and ready for discharge home.  Headache is improving.  Vital signs reassuring.  I feel that she is appropriate for  discharge home.  All questions were answered.  She is agreeable to close outpatient follow-up with PCP.  Final Clinical Impression(s) / ED Diagnoses Final diagnoses:  Migraine with aura and without status migrainosus, not intractable    Rx / DC Orders ED Discharge Orders     None        Pauline Aus, PA-C 12/21/20 1620    Bethann Berkshire, MD 12/23/20 [phone]

## 2020-12-21 NOTE — ED Triage Notes (Signed)
Headache for 8 days, history of mnigraines

## 2020-12-21 NOTE — Discharge Instructions (Addendum)
Your migraine was likely brought on by recent stressors.  Follow-up with your primary care provider for recheck.  Return to the emergency department for any new or worsening symptoms.

## 2020-12-24 ENCOUNTER — Other Ambulatory Visit: Payer: Self-pay

## 2020-12-24 ENCOUNTER — Ambulatory Visit: Payer: BC Managed Care – PPO | Admitting: Neurology

## 2020-12-24 ENCOUNTER — Encounter: Payer: Self-pay | Admitting: Neurology

## 2020-12-24 VITALS — BP 136/80 | HR 87 | Ht 63.0 in | Wt 135.5 lb

## 2020-12-24 DIAGNOSIS — G479 Sleep disorder, unspecified: Secondary | ICD-10-CM | POA: Diagnosis not present

## 2020-12-24 DIAGNOSIS — G43709 Chronic migraine without aura, not intractable, without status migrainosus: Secondary | ICD-10-CM | POA: Diagnosis not present

## 2020-12-24 MED ORDER — RIZATRIPTAN BENZOATE 10 MG PO TBDP: 10.0000 mg | ORAL_TABLET | ORAL | 12 refills | Status: AC | PRN

## 2020-12-24 MED ORDER — MEPERIDINE HCL 50 MG PO TABS: 50.0000 mg | ORAL_TABLET | Freq: Four times a day (QID) | ORAL | 0 refills | Status: DC | PRN

## 2020-12-24 MED ORDER — NAPROXEN 500 MG PO TABS: 500.0000 mg | ORAL_TABLET | Freq: Two times a day (BID) | ORAL | 12 refills | Status: AC

## 2020-12-24 MED ORDER — AIMOVIG 140 MG/ML ~~LOC~~ SOAJ: 140.0000 mg | SUBCUTANEOUS | 12 refills | Status: AC

## 2020-12-24 MED ORDER — PROMETHAZINE HCL 25 MG PO TABS: 25.0000 mg | ORAL_TABLET | Freq: Three times a day (TID) | ORAL | 12 refills | Status: AC | PRN

## 2020-12-24 NOTE — Patient Instructions (Signed)
Take as medications as prescribed  Brain MRI, I will contact you to discuss the results  Follow up in 6 months or sooner if worse
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
## 2020-12-27 ENCOUNTER — Telehealth: Payer: Self-pay | Admitting: Neurology

## 2020-12-27 ENCOUNTER — Other Ambulatory Visit: Payer: Self-pay | Admitting: Neurology

## 2020-12-27 ENCOUNTER — Encounter: Payer: BC Managed Care – PPO | Admitting: Nurse Practitioner

## 2020-12-27 ENCOUNTER — Encounter: Payer: Self-pay | Admitting: *Deleted

## 2020-12-27 ENCOUNTER — Telehealth: Payer: Self-pay | Admitting: *Deleted

## 2020-12-27 NOTE — Telephone Encounter (Signed)
I spoke to patient. She is aware that Dr. Teresa Coombs is out of the office. The request has been sent to Dr. Pearlean Brownie for approval.

## 2020-12-27 NOTE — Telephone Encounter (Addendum)
Rizatriptan is available without PA. Ready for pick up at [facility].  She is going to have to pay out-of-pocket for the 15 tablets of Demerol 50mg  (ins will not cover). I spoke to [place] at [place]. The prescription at that location has been voided (cost $600+). The rx has been sent to [facility]. She is able to get the medication there,using a goodrx[facility]com coupon, for $30.33.

## 2020-12-27 NOTE — Telephone Encounter (Signed)
PA for Aimovig 140mg  started on covermymeds (key: [id number]). Pt has pharmacy coverage through [facility] [phone]). Decision pending.

## 2020-12-27 NOTE — Addendum Note (Signed)
Addended by: Lindell Spar C on: 12/27/2020 10:04 AM   Modules accepted: Orders

## 2020-12-27 NOTE — Telephone Encounter (Signed)
Pt has been told by [facility] that they have questions re:why meperidine (DEMEROL) 50 MG tablet & rizatriptan (MAXALT-MLT) 10 MG disintegrating tablet were prescribed over other medications. Pt was not given a specific number to have the office call.  Pt made suggestion that the medications be called into [facility].  Please call.

## 2020-12-27 NOTE — Telephone Encounter (Signed)
Pt called she states the [facility] [phone] does not have the meperidine (DEMEROL) 50 MG tablet. Pt states if you could let her know the status of it in Mychart.

## 2020-12-27 NOTE — Telephone Encounter (Signed)
Opened in error

## 2020-12-29 NOTE — Telephone Encounter (Signed)
PA approved through 12/26/2021.

## 2020-12-29 NOTE — Telephone Encounter (Signed)
Demerol not indicated for acute management of headaches.  We will try the patient on a combination of Maxalt, Phenergan and Naproxen.  Windell Norfolk, MD

## 2020-12-30 ENCOUNTER — Telehealth: Payer: Self-pay | Admitting: Neurology

## 2020-12-30 NOTE — Telephone Encounter (Signed)
LVM for pt to call back to schedule  [employer] auth: [id number] (exp. 12/29/20 to 01/27/21)

## 2020-12-31 NOTE — Telephone Encounter (Signed)
[employer] [place](Jessica Whitaker) called PA is required of medically necessity for meperidine (DEMEROL) 50 MG tablet Would like a call from the nurse.  Contact info: [phone]

## 2021-01-03 NOTE — Telephone Encounter (Addendum)
Dr. Teresa Coombs is no longer planning to authorize this medication. The rx sent on 12/24/20 was voided at the pharmacy. I returned the call to [employer] (spoke to [employer]) and provided this update.

## 2021-01-03 NOTE — Addendum Note (Signed)
Addended by: Lilla Shook on: 01/03/2021 07:51 AM   Modules accepted: Orders

## 2021-01-04 MED ORDER — BUTALBITAL-APAP-CAFFEINE 50-325-40 MG PO TABS: 1.0000 | ORAL_TABLET | Freq: Four times a day (QID) | ORAL | 3 refills | Status: DC | PRN

## 2021-01-05 ENCOUNTER — Encounter: Payer: Self-pay | Admitting: *Deleted

## 2021-01-05 NOTE — Telephone Encounter (Signed)
I had a long, extended conversation with this patient. She was tearful during the call. She reports having a persistent migraine. Failed rizatriptan, heart races w/ Fiorcet, stomach hurts with naproxen, cannot tolerate Benadryl, Toradol does not work, migraine cocktail offered in ED is not helpful. She repeatedly requested Demerol. States this is the only medication that works for her. Says her grandmother was the same way. She went on to say she researched Demerol and that is was an appropriate medication to prescribed for an abortive therapy. She said she would even accept one pill, if an entire prescription could not be offered. Says the ED also refused to give her the medication. Reports her previous physician wrote her Demerol but the practice has been sold and she is unable to obtain the records. I encouraged her to drink plenty of water, rest in a dark room, use ice or warm packs (whichever works best), and try to repeat her available home medications. She started crying again stating this would not help that only Demerol would rid the pain. Says her family is worried about her. She is missing work. If all home meds fail again, she should present to the ED for a repeat migraine cocktail. She is going to speak to her PCP about another referral to a headache clinic that may be willing to offer the requested opioid. However, in case she is unable to locate a facility, she wanted to keep her current pending appt in place here. She verbalized understanding that Demerol will not be an option at our practice. Fioricet refills were also voided at the pharmacy.

## 2021-01-26 DIAGNOSIS — F410 Panic disorder [episodic paroxysmal anxiety] without agoraphobia: Secondary | ICD-10-CM | POA: Diagnosis not present

## 2021-01-26 DIAGNOSIS — F411 Generalized anxiety disorder: Secondary | ICD-10-CM | POA: Diagnosis not present

## 2021-02-15 DIAGNOSIS — G3184 Mild cognitive impairment, so stated: Secondary | ICD-10-CM | POA: Diagnosis not present

## 2021-02-15 DIAGNOSIS — M545 Low back pain, unspecified: Secondary | ICD-10-CM | POA: Diagnosis not present

## 2021-02-15 DIAGNOSIS — G43711 Chronic migraine without aura, intractable, with status migrainosus: Secondary | ICD-10-CM | POA: Diagnosis not present

## 2021-02-15 DIAGNOSIS — K219 Gastro-esophageal reflux disease without esophagitis: Secondary | ICD-10-CM | POA: Diagnosis not present

## 2021-03-01 ENCOUNTER — Encounter: Payer: BC Managed Care – PPO | Admitting: Nurse Practitioner

## 2021-03-09 ENCOUNTER — Institutional Professional Consult (permissible substitution): Payer: BC Managed Care – PPO | Admitting: Neurology

## 2021-03-16 ENCOUNTER — Ambulatory Visit: Payer: BC Managed Care – PPO | Admitting: Nurse Practitioner

## 2021-03-16 NOTE — Progress Notes (Deleted)
This visit occurred during the SARS-CoV-2 public health emergency.  Safety protocols were in place, including screening questions prior to the visit, additional usage of staff PPE, and extensive cleaning of exam room while observing appropriate contact time as indicated for disinfecting solutions.  Subjective:     Patient ID: Jessica Whitaker , female    DOB: September 29, 1965 , 55 y.o.   MRN: [id number]   No chief complaint on file.   HPI  HPI   Past Medical History:  Diagnosis Date   Anxiety    Migraine      Family History  Problem Relation Age of Onset   Healthy Mother    Heart disease Father    Hyperlipidemia Father    Diabetes Father    Dementia Father    Bladder Cancer Father    Cancer Maternal Grandmother    Brain cancer Maternal Grandmother    Lung cancer Maternal Grandmother    Healthy Maternal Grandfather    Cancer Paternal Grandmother    Migraines Paternal Grandmother      Current Outpatient Medications:    ALPRAZolam (XANAX) 0.5 MG tablet, Take 0.5 mg by mouth at bedtime as needed for anxiety., Disp: , Rfl:    Ascorbic Acid (VITAMIN C PO), Take by mouth., Disp: , Rfl:    celecoxib (CELEBREX) 200 MG capsule, Take 200 mg by mouth daily., Disp: , Rfl:    Erenumab-aooe (AIMOVIG) 140 MG/ML SOAJ, Inject 140 mg into the skin every 30 (thirty) days., Disp: 1.12 mL, Rfl: 12   ibuprofen (ADVIL) 800 MG tablet, Take 1 tablet (800 mg total) by mouth every 8 (eight) hours as needed., Disp: 30 tablet, Rfl: 0   Multiple Vitamins-Minerals (ZINC PO), Take by mouth., Disp: , Rfl:    naproxen (NAPROSYN) 500 MG tablet, Take 1 tablet (500 mg total) by mouth 2 (two) times daily with a meal., Disp: 15 tablet, Rfl: 12   promethazine (PHENERGAN) 25 MG tablet, Take 1 tablet (25 mg total) by mouth every 8 (eight) hours as needed for nausea or vomiting., Disp: 15 tablet, Rfl: 12   rizatriptan (MAXALT-MLT) 10 MG disintegrating tablet, Take 1 tablet (10 mg total) by mouth as needed., Disp: 10  tablet, Rfl: 12   Allergies  Allergen Reactions   Fioricet [Butalbital-Apap-Caffeine] Other (See Comments)    "Heart races"   Levaquin [Levofloxacin] Nausea And Vomiting   Penicillins Other (See Comments)    "my mother told us we were allergic to it"   Lortab [Hydrocodone-Acetaminophen] Rash     Review of Systems   There were no vitals filed for this visit. There is no height or weight on file to calculate BMI.   Objective:  Physical Exam      Assessment And Plan:     There are no diagnoses linked to this encounter.    Patient was given opportunity to ask questions. Patient verbalized understanding of the plan and was able to repeat key elements of the plan. All questions were answered to their satisfaction.  Patrecia Pour [place]ittleton, CMA   I, Patrecia Pour [place], CMA, have reviewed all documentation for this visit. The documentation on 03/16/21 for the exam, diagnosis, procedures, and orders are all accurate and complete.   IF YOU HAVE BEEN REFERRED TO A SPECIALIST, IT MAY TAKE 1-2 WEEKS TO SCHEDULE/PROCESS THE REFERRAL. IF YOU HAVE NOT HEARD FROM US/SPECIALIST IN TWO WEEKS, PLEASE GIVE [place] A CALL AT [phone].   THE PATIENT IS ENCOURAGED TO PRACTICE SOCIAL DISTANCING DUE TO  THE COVID-19 PANDEMIC.

## 2021-05-03 DIAGNOSIS — F4323 Adjustment disorder with mixed anxiety and depressed mood: Secondary | ICD-10-CM | POA: Diagnosis not present

## 2021-05-03 DIAGNOSIS — F410 Panic disorder [episodic paroxysmal anxiety] without agoraphobia: Secondary | ICD-10-CM | POA: Diagnosis not present

## 2021-05-03 DIAGNOSIS — F411 Generalized anxiety disorder: Secondary | ICD-10-CM | POA: Diagnosis not present

## 2021-06-23 ENCOUNTER — Telehealth: Payer: Self-pay | Admitting: Neurology

## 2021-06-23 ENCOUNTER — Ambulatory Visit: Payer: BC Managed Care – PPO | Admitting: Neurology

## 2021-06-23 NOTE — Telephone Encounter (Signed)
Pt cancelled appt due to mother having surgery. ?

## 2021-06-27 DIAGNOSIS — Z79891 Long term (current) use of opiate analgesic: Secondary | ICD-10-CM | POA: Diagnosis not present

## 2021-06-27 DIAGNOSIS — M545 Low back pain, unspecified: Secondary | ICD-10-CM | POA: Diagnosis not present

## 2021-06-27 DIAGNOSIS — G43711 Chronic migraine without aura, intractable, with status migrainosus: Secondary | ICD-10-CM | POA: Diagnosis not present

## 2021-07-26 DIAGNOSIS — F410 Panic disorder [episodic paroxysmal anxiety] without agoraphobia: Secondary | ICD-10-CM | POA: Diagnosis not present

## 2021-07-26 DIAGNOSIS — F411 Generalized anxiety disorder: Secondary | ICD-10-CM | POA: Diagnosis not present

## 2021-07-26 DIAGNOSIS — G478 Other sleep disorders: Secondary | ICD-10-CM | POA: Diagnosis not present

## 2021-08-24 DIAGNOSIS — F410 Panic disorder [episodic paroxysmal anxiety] without agoraphobia: Secondary | ICD-10-CM | POA: Diagnosis not present

## 2021-08-24 DIAGNOSIS — F411 Generalized anxiety disorder: Secondary | ICD-10-CM | POA: Diagnosis not present

## 2021-09-22 ENCOUNTER — Telehealth: Payer: BC Managed Care – PPO | Admitting: Internal Medicine

## 2021-09-22 ENCOUNTER — Other Ambulatory Visit: Payer: Self-pay

## 2021-09-22 ENCOUNTER — Encounter: Payer: Self-pay | Admitting: Internal Medicine

## 2021-09-22 VITALS — Temp 101.0°F

## 2021-09-22 DIAGNOSIS — U071 COVID-19: Secondary | ICD-10-CM | POA: Diagnosis not present

## 2021-09-22 MED ORDER — BENZONATATE 100 MG PO CAPS: 100.0000 mg | ORAL_CAPSULE | Freq: Three times a day (TID) | ORAL | 1 refills | Status: AC | PRN

## 2021-09-22 MED ORDER — LAGEVRIO 200 MG PO CAPS: 4.0000 | ORAL_CAPSULE | Freq: Two times a day (BID) | ORAL | 0 refills | Status: AC

## 2021-09-22 MED ORDER — PROMETHAZINE-DM 6.25-15 MG/5ML PO SYRP: 5.0000 mL | ORAL_SOLUTION | Freq: Four times a day (QID) | ORAL | 0 refills | Status: AC | PRN

## 2021-09-22 NOTE — Progress Notes (Signed)
Virtual Visit via Video   This visit type was conducted due to national recommendations for restrictions regarding the COVID-19 Pandemic (e.g. social distancing) in an effort to limit this patient's exposure and mitigate transmission in our community.  Due to her co-morbid illnesses, this patient is at least at moderate risk for complications without adequate follow up.  This format is felt to be most appropriate for this patient at this time.  All issues noted in this document were discussed and addressed.  A limited physical exam was performed with this format.    This visit type was conducted due to national recommendations for restrictions regarding the COVID-19 Pandemic (e.g. social distancing) in an effort to limit this patient's exposure and mitigate transmission in our community.  Patients identity confirmed using two different identifiers.  This format is felt to be most appropriate for this patient at this time.  All issues noted in this document were discussed and addressed.  No physical exam was performed (except for noted visual exam findings with Video Visits).    Date:  09/24/2021   ID:  Jessica Whitaker, DOB [birth year], MRN [id number]  Patient Location:  Home  Provider location:   Office    Chief Complaint:  "I have COVID"  History of Present Illness:    Jessica Whitaker is a 56 y.o. female who presents via video conferencing for a telehealth visit today.    The patient does have symptoms concerning for COVID-19 infection (fever, chills, cough, or new shortness of breath).   She presents today for virtual visit. She prefers this method of contact due to COVID-19 pandemic.  She presents today for treatment of COVID.  She started to feel bad on Tuesday 09/20/2021. She found out that she has coworkers who were recently diagnosed with COVID. She started to experience sore throat, fever, and body aches so she did COVID tests which were positive. She started to take Robitussin and  ibuprofen without improvement of symptoms. She now has a non-productive cough.        Past Medical History:  Diagnosis Date   Anxiety    Migraine    Past Surgical History:  Procedure Laterality Date   CATARACT EXTRACTION, BILATERAL     COLON RESECTION     RETINAL DETACHMENT SURGERY       Current Meds  Medication Sig   ALPRAZolam (XANAX) 0.5 MG tablet Take 0.5 mg by mouth at bedtime as needed for anxiety.   Ascorbic Acid (VITAMIN C PO) Take by mouth.   benzonatate (TESSALON PERLES) 100 MG capsule Take 1 capsule (100 mg total) by mouth 3 (three) times daily as needed for cough.   Erenumab-aooe (AIMOVIG) 140 MG/ML SOAJ Inject 140 mg into the skin every 30 (thirty) days.   ibuprofen (ADVIL) 800 MG tablet Take 1 tablet (800 mg total) by mouth every 8 (eight) hours as needed.   meperidine (DEMEROL) 50 MG tablet Take 50 mg by mouth daily as needed.   molnupiravir EUA (LAGEVRIO) 200 MG CAPS capsule Take 4 capsules (800 mg total) by mouth 2 (two) times daily for 5 days.   Multiple Vitamins-Minerals (ZINC PO) Take by mouth.   naproxen (NAPROSYN) 500 MG tablet Take 1 tablet (500 mg total) by mouth 2 (two) times daily with a meal.   promethazine (PHENERGAN) 25 MG tablet Take 1 tablet (25 mg total) by mouth every 8 (eight) hours as needed for nausea or vomiting.   promethazine-dextromethorphan (PROMETHAZINE-DM) 6.25-15 MG/5ML syrup Take 5 mLs by mouth  4 (four) times daily as needed for cough.   rizatriptan (MAXALT-MLT) 10 MG disintegrating tablet Take 1 tablet (10 mg total) by mouth as needed.     Allergies:   Fioricet [butalbital-apap-caffeine], Levaquin [levofloxacin], Penicillins, and Lortab [hydrocodone-acetaminophen]   Social History   Tobacco Use   Smoking status: Former    Packs/day: 1.50    Years: 30.00    Total pack years: 45.00    Types: Cigarettes   Smokeless tobacco: Current  Vaping Use   Vaping Use: Every day   Start date: 06/16/2019   Devices: Jouel  Substance Use  Topics   Alcohol use: Never   Drug use: Never     Family Hx: The patient's family history includes Bladder Cancer in her father; Brain cancer in her maternal grandmother; Cancer in her maternal grandmother and paternal grandmother; Dementia in her father; Diabetes in her father; Healthy in her maternal grandfather and mother; Heart disease in her father; Hyperlipidemia in her father; Lung cancer in her maternal grandmother; Migraines in her paternal grandmother.  ROS:   Please see the history of present illness.    Review of Systems  Constitutional:  Positive for fever and malaise/fatigue.  Respiratory:  Positive for cough.   Cardiovascular: Negative.   Gastrointestinal: Negative.   Neurological: Negative.   Psychiatric/Behavioral: Negative.      All other systems reviewed and are negative.   Labs/Other Tests and Data Reviewed:    Recent Labs: No results found for requested labs within last 365 days.   Recent Lipid Panel Lab Results  Component Value Date/Time   CHOL 299 (H) 06/21/2020 04:08 PM   TRIG 127 06/21/2020 04:08 PM   HDL 79 06/21/2020 04:08 PM   CHOLHDL 3.8 06/21/2020 04:08 PM   LDLCALC 198 (H) 06/21/2020 04:08 PM    Wt Readings from Last 3 Encounters:  12/24/20 135 lb 8 oz (61.5 kg)  12/21/20 132 lb 15 oz (60.3 kg)  06/21/20 128 lb (58.1 kg)     Exam:    Vital Signs:  Temp (!) 101 F (38.3 C)     Physical Exam Vitals and nursing note reviewed.  Constitutional:      Appearance: She is ill-appearing.  HENT:     Head: Normocephalic and atraumatic.  Eyes:     Extraocular Movements: Extraocular movements intact.  Pulmonary:     Effort: Pulmonary effort is normal.     Comments: She coughs frequently during visit.  Musculoskeletal:     Cervical back: Normal range of motion.  Neurological:     Mental Status: She is alert and oriented to person, place, and time.  Psychiatric:        Mood and Affect: Affect normal.    ASSESSMENT & PLAN:    1.  COVID-19 - Temperature monitoring; Future She would like to receive treatment. Since there is no recent GFR on file, I will send rx Lagevrio to her pharmacy. Possible side effects d/w patient. I advised patient to take Vitamin C, D, Zinc.  Keep yourself hydrated with a lot of water and rest. Take Delsym for cough and Mucinex as needed. Take Tylenol or pain reliever every 4-6 hours as needed for pain/fever/body ache. If you have elevated blood pressure, you can take OTC Coricidin. You can also take OTC Oscillococcinum, a homeopathic remedy,  to help with your symptoms.  Educated patient that if symptoms get worse or if he/she experiences any SOB, chest pain or pain in their legs to seek immediate emergency care.  Continue to monitor your oxygen levels. Call office ASAP if you have any questions. Quarantine for 5 days if tested positive and no symptoms or 10 days if tested positive and you are with symptoms. Wear a mask around other people.    COVID-19 Education: The signs and symptoms of COVID-19 were discussed with the patient and how to seek care for testing (follow up with PCP or arrange E-visit).  The importance of social distancing was discussed today.  Patient Risk:   After full review of this patients clinical status, I feel that they are at least moderate risk at this time.  Time:   Today, I have spent 17 minutes with the patient with telehealth technology discussing above diagnoses.     Medication Adjustments/Labs and Tests Ordered: Current medicines are reviewed at length with the patient today.  Concerns regarding medicines are outlined above.   Tests Ordered: No orders of the defined types were placed in this encounter.   Medication Changes: Meds ordered this encounter  Medications   molnupiravir EUA (LAGEVRIO) 200 MG CAPS capsule    Sig: Take 4 capsules (800 mg total) by mouth 2 (two) times daily for 5 days.    Dispense:  40 capsule    Refill:  0   benzonatate (TESSALON PERLES)  100 MG capsule    Sig: Take 1 capsule (100 mg total) by mouth 3 (three) times daily as needed for cough.    Dispense:  30 capsule    Refill:  1   promethazine-dextromethorphan (PROMETHAZINE-DM) 6.25-15 MG/5ML syrup    Sig: Take 5 mLs by mouth 4 (four) times daily as needed for cough.    Dispense:  118 mL    Refill:  0    Disposition:  Follow up prn  Signed, Gwynneth Aliment, MD

## 2021-09-24 ENCOUNTER — Encounter: Payer: Self-pay | Admitting: Internal Medicine

## 2021-09-27 ENCOUNTER — Ambulatory Visit: Payer: BC Managed Care – PPO

## 2021-09-27 ENCOUNTER — Encounter: Payer: Self-pay | Admitting: Internal Medicine

## 2021-09-27 ENCOUNTER — Encounter: Payer: Self-pay | Admitting: Nurse Practitioner

## 2021-09-27 ENCOUNTER — Telehealth: Payer: Self-pay

## 2021-09-27 NOTE — Telephone Encounter (Signed)
Please write note that she is unable to travel due to current illness.   RS

## 2021-09-27 NOTE — Telephone Encounter (Signed)
I called patient and left her a vm letting her know her that her letter has been sent to her in [place]

## 2021-10-12 DIAGNOSIS — Z79891 Long term (current) use of opiate analgesic: Secondary | ICD-10-CM | POA: Diagnosis not present

## 2021-10-12 DIAGNOSIS — G43711 Chronic migraine without aura, intractable, with status migrainosus: Secondary | ICD-10-CM | POA: Diagnosis not present

## 2021-10-12 DIAGNOSIS — M545 Low back pain, unspecified: Secondary | ICD-10-CM | POA: Diagnosis not present

## 2021-10-31 ENCOUNTER — Telehealth: Payer: BC Managed Care – PPO | Admitting: Nurse Practitioner

## 2021-10-31 ENCOUNTER — Other Ambulatory Visit: Payer: Self-pay | Admitting: Nurse Practitioner

## 2021-10-31 DIAGNOSIS — M545 Low back pain, unspecified: Secondary | ICD-10-CM

## 2021-10-31 DIAGNOSIS — M5440 Lumbago with sciatica, unspecified side: Secondary | ICD-10-CM | POA: Diagnosis not present

## 2021-10-31 MED ORDER — CYCLOBENZAPRINE HCL 10 MG PO TABS: 10.0000 mg | ORAL_TABLET | Freq: Three times a day (TID) | ORAL | 0 refills | Status: DC | PRN

## 2021-10-31 MED ORDER — NAPROXEN 500 MG PO TABS: 500.0000 mg | ORAL_TABLET | Freq: Two times a day (BID) | ORAL | 0 refills | Status: AC

## 2021-10-31 MED ORDER — TIZANIDINE HCL 4 MG PO TABS: 4.0000 mg | ORAL_TABLET | Freq: Four times a day (QID) | ORAL | 0 refills | Status: DC | PRN

## 2021-10-31 MED ORDER — TIZANIDINE HCL 2 MG PO TABS: 2.0000 mg | ORAL_TABLET | Freq: Three times a day (TID) | ORAL | 0 refills | Status: AC | PRN

## 2021-10-31 NOTE — Progress Notes (Signed)
We are sorry that you are not feeling well.  Here is how we plan to help!  Based on what you have shared with me it looks like you mostly have acute back pain.  Acute back pain is defined as musculoskeletal pain that can resolve in 1-3 weeks with conservative treatment.  I have prescribed Naprosyn 500 mg take one by mouth twice a day non-steroid anti-inflammatory (NSAID) this is the only NSAID medication you should be taking, you can also you tylenol for pain as needed. Do NOT take (ibuprofen, advil, aleve or motrin while using Naprosyn. We will also prescribe Flexeril 10 mg every eight hours as needed which is a muscle relaxer . Your should not use any other muscle relaxers while using flexeril. This medication will also cause drowsiness and you should not drive or operate machinery while using. Some patients prefer to just use the muscle relaxant before bed.   Some patients experience stomach irritation or in increased heartburn with anti-inflammatory drugs.  Please keep in mind that muscle relaxer's can cause fatigue and should not be taken while at work or driving.  Back pain is very common.  The pain often gets better over time.  The cause of back pain is usually not dangerous.  Most people can learn to manage their back pain on their own.  Home Care Stay active.  Start with short walks on flat ground if you can.  Try to walk farther each day. Do not sit, drive or stand in one place for more than 30 minutes.  Do not stay in bed. Do not avoid exercise or work.  Activity can help your back heal faster. Be careful when you bend or lift an object.  Bend at your knees, keep the object close to you, and do not twist. Sleep on a firm mattress.  Lie on your side, and bend your knees.  If you lie on your back, put a pillow under your knees. Only take medicines as told by your doctor. Put ice on the injured area. Put ice in a plastic bag Place a towel between your skin and the bag Leave the ice on for  15-20 minutes, 3-4 times a day for the first 2-3 days. 210 After that, you can switch between ice and heat packs. Ask your doctor about back exercises or massage. Avoid feeling anxious or stressed.  Find good ways to deal with stress, such as exercise.  Get Help Right Way If: Your pain does not go away with rest or medicine. Your pain does not go away in 1 week. You have new problems. You do not feel well. The pain spreads into your legs. You cannot control when you poop (bowel movement) or pee (urinate) You feel sick to your stomach (nauseous) or throw up (vomit) You have belly (abdominal) pain. You feel like you may pass out (faint). If you develop a fever.  Make Sure you: Understand these instructions. Will watch your condition Will get help right away if you are not doing well or get worse.  Your e-visit answers were reviewed by a board certified advanced clinical practitioner to complete your personal care plan.  Depending on the condition, your plan could have included both over the counter or prescription medications.  If there is a problem please reply  once you have received a response from your provider.  Your safety is important to [place].  If you have drug allergies check your prescription carefully.    You can use MyChart  to ask questions about today's visit, request a non-urgent call back, or ask for a work or school excuse for 24 hours related to this e-Visit. If it has been greater than 24 hours you will need to follow up with your provider, or enter a new e-Visit to address those concerns.  You will get an e-mail in the next two days asking about your experience.  I hope that your e-visit has been valuable and will speed your recovery. Thank you for using e-visits.   I spent approximately 5 minutes reviewing the patient's history, current symptoms and coordinating their plan of care today.    Meds ordered this encounter  Medications   naproxen (NAPROSYN) 500 MG tablet     Sig: Take 1 tablet (500 mg total) by mouth 2 (two) times daily with a meal.    Dispense:  30 tablet    Refill:  0   cyclobenzaprine (FLEXERIL) 10 MG tablet    Sig: Take 1 tablet (10 mg total) by mouth 3 (three) times daily as needed for muscle spasms. Will cause drowsiness do not operate machinery or drive while using.    Dispense:  30 tablet    Refill:  0

## 2021-10-31 NOTE — Progress Notes (Signed)
Discontinued Flexeril.  Meds ordered this encounter  Medications   tiZANidine (ZANAFLEX) 2 MG tablet    Sig: Take 1 tablet (2 mg total) by mouth every 8 (eight) hours as needed for up to 5 days for muscle spasms.    Dispense:  15 tablet    Refill:  0    Per patient's request this muscle relaxer is tolerated better.   Viviano Simas FNP-C

## 2021-10-31 NOTE — Addendum Note (Signed)
Addended by: Margaretann Loveless on: 10/31/2021 08:50 AM   Modules accepted: Orders

## 2021-11-13 ENCOUNTER — Other Ambulatory Visit: Payer: Self-pay | Admitting: Nurse Practitioner

## 2021-11-13 DIAGNOSIS — M5440 Lumbago with sciatica, unspecified side: Secondary | ICD-10-CM

## 2022-01-03 DIAGNOSIS — M545 Low back pain, unspecified: Secondary | ICD-10-CM | POA: Diagnosis not present

## 2022-01-03 DIAGNOSIS — Z79891 Long term (current) use of opiate analgesic: Secondary | ICD-10-CM | POA: Diagnosis not present

## 2022-01-03 DIAGNOSIS — G43711 Chronic migraine without aura, intractable, with status migrainosus: Secondary | ICD-10-CM | POA: Diagnosis not present

## 2022-01-10 ENCOUNTER — Other Ambulatory Visit: Payer: Self-pay | Admitting: Neurology

## 2022-01-31 ENCOUNTER — Other Ambulatory Visit: Payer: Self-pay | Admitting: Neurology

## 2022-02-02 DIAGNOSIS — F410 Panic disorder [episodic paroxysmal anxiety] without agoraphobia: Secondary | ICD-10-CM | POA: Diagnosis not present

## 2022-02-02 DIAGNOSIS — F411 Generalized anxiety disorder: Secondary | ICD-10-CM | POA: Diagnosis not present

## 2022-03-28 DIAGNOSIS — G43711 Chronic migraine without aura, intractable, with status migrainosus: Secondary | ICD-10-CM | POA: Diagnosis not present

## 2022-03-28 DIAGNOSIS — M545 Low back pain, unspecified: Secondary | ICD-10-CM | POA: Diagnosis not present

## 2022-03-28 DIAGNOSIS — Z79891 Long term (current) use of opiate analgesic: Secondary | ICD-10-CM | POA: Diagnosis not present

## 2022-05-02 DIAGNOSIS — F411 Generalized anxiety disorder: Secondary | ICD-10-CM | POA: Diagnosis not present

## 2022-05-02 DIAGNOSIS — F410 Panic disorder [episodic paroxysmal anxiety] without agoraphobia: Secondary | ICD-10-CM | POA: Diagnosis not present

## 2022-05-18 DIAGNOSIS — Z049 Encounter for examination and observation for unspecified reason: Secondary | ICD-10-CM | POA: Diagnosis not present

## 2022-05-18 DIAGNOSIS — G43719 Chronic migraine without aura, intractable, without status migrainosus: Secondary | ICD-10-CM | POA: Diagnosis not present

## 2022-06-13 ENCOUNTER — Ambulatory Visit: Payer: BC Managed Care – PPO | Admitting: Family Medicine

## 2022-06-30 ENCOUNTER — Ambulatory Visit: Payer: BC Managed Care – PPO | Admitting: Family Medicine

## 2022-09-26 ENCOUNTER — Emergency Department (HOSPITAL_COMMUNITY)
Admission: EM | Admit: 2022-09-26 | Discharge: 2022-09-26 | Disposition: A | Discharge status: Home / Self Care | Payer: BC Managed Care – PPO | Attending: Emergency Medicine | Admitting: Emergency Medicine

## 2022-09-26 ENCOUNTER — Other Ambulatory Visit: Payer: Self-pay

## 2022-09-26 ENCOUNTER — Encounter (HOSPITAL_COMMUNITY): Payer: Self-pay | Admitting: Emergency Medicine

## 2022-09-26 DIAGNOSIS — W540XXA Bitten by dog, initial encounter: Secondary | ICD-10-CM

## 2022-09-26 DIAGNOSIS — S61232A Puncture wound without foreign body of right middle finger without damage to nail, initial encounter: Secondary | ICD-10-CM | POA: Insufficient documentation

## 2022-09-26 MED ORDER — IBUPROFEN 800 MG PO TABS
800.0000 mg | ORAL_TABLET | Freq: Once | ORAL | Status: AC
  Administered 2022-09-26: 800 mg via ORAL
  Filled 2022-09-26: qty 1

## 2022-09-26 MED ORDER — AMOXICILLIN-POT CLAVULANATE 875-125 MG PO TABS
1.0000 | ORAL_TABLET | Freq: Once | ORAL | Status: AC
  Administered 2022-09-26: 1 via ORAL
  Filled 2022-09-26: qty 1

## 2022-09-26 NOTE — ED Provider Notes (Signed)
[place] EMERGENCY DEPARTMENT AT [facility] Provider Note   CSN: [id number] Arrival date & time: 09/26/22  1645     History  Chief Complaint  Patient presents with   Animal Bite    Jessica Whitaker is a 57 y.o. femalewho presents to the ED with chief complaint of finger injury due to dog bite. She states around 4pm today, her medical alert dog bit her while she was attempting to inject hydrogen peroxide into his mouth. She states only the third digit on her right hand was affected and she has multiple bite marks with previous bleeding that was difficult to control. No pain medications were taken prior to ED arrival. The patient states this is uncharacteristic for her dog, but he recently had a procedure and is currently taking gabapentin among other medication he does not usually take. She states the pain is constant and throbbing, but does not radiate to her hand, wrist, or arm.   Animal Bite      Home Medications Prior to Admission medications   Medication Sig Start Date End Date Taking? Authorizing Provider  ALPRAZolam Prudy Feeler) 0.5 MG tablet Take 0.5 mg by mouth at bedtime as needed for anxiety.    [provider]  Ascorbic Acid (VITAMIN C PO) Take by mouth.    [provider]  Erenumab-aooe (AIMOVIG) 140 MG/ML SOAJ Inject 140 mg into the skin every 30 (thirty) days. 12/24/20   Windell Norfolk, MD  ibuprofen (ADVIL) 800 MG tablet Take 1 tablet (800 mg total) by mouth every 8 (eight) hours as needed. 06/21/20   Arnette Felts, FNP  meperidine (DEMEROL) 50 MG tablet Take 50 mg by mouth daily as needed. 09/15/21   [provider]  Multiple Vitamins-Minerals (ZINC PO) Take by mouth.    [provider]  naproxen (NAPROSYN) 500 MG tablet Take 1 tablet (500 mg total) by mouth 2 (two) times daily with a meal. 12/24/20   Windell Norfolk, MD  naproxen (NAPROSYN) 500 MG tablet Take 1 tablet (500 mg total) by mouth 2 (two) times daily with a meal. 10/31/21    Viviano Simas, FNP  promethazine (PHENERGAN) 25 MG tablet Take 1 tablet (25 mg total) by mouth every 8 (eight) hours as needed for nausea or vomiting. 12/24/20   Windell Norfolk, MD  promethazine-dextromethorphan (PROMETHAZINE-DM) 6.25-15 MG/5ML syrup Take 5 mLs by mouth 4 (four) times daily as needed for cough. 09/22/21   Dorothyann Peng, MD  rizatriptan (MAXALT-MLT) 10 MG disintegrating tablet Take 1 tablet (10 mg total) by mouth as needed. 12/24/20   Windell Norfolk, MD      Allergies    Fioricet [butalbital-apap-caffeine], Levaquin [levofloxacin], Penicillins, and Lortab [hydrocodone-acetaminophen]    Review of Systems   Review of Systems  Physical Exam Updated Vital Signs BP (!) 166/85 (BP Location: Right Arm)   Pulse 84   Temp 98.1 F (36.7 C) (Oral)   Resp 16   Ht 5\' 3"  (1.6 m)   Wt 67.1 kg   SpO2 99%   BMI 26.22 kg/m  Physical Exam Vitals and nursing note reviewed.  Constitutional:      General: She is not in acute distress.    Appearance: She is well-developed. She is not diaphoretic.  HENT:     Head: Normocephalic and atraumatic.     Right Ear: External ear normal.     Left Ear: External ear normal.     Nose: Nose normal.     Mouth/Throat:     Mouth: Mucous  membranes are moist.  Eyes:     General: No scleral icterus.    Conjunctiva/sclera: Conjunctivae normal.  Cardiovascular:     Rate and Rhythm: Normal rate and regular rhythm.     Heart sounds: Normal heart sounds. No murmur heard.    No friction rub. No gallop.  Pulmonary:     Effort: Pulmonary effort is normal. No respiratory distress.     Breath sounds: Normal breath sounds.  Abdominal:     General: Bowel sounds are normal. There is no distension.     Palpations: Abdomen is soft. There is no mass.     Tenderness: There is no abdominal tenderness. There is no guarding.  Musculoskeletal:     Cervical back: Normal range of motion.  Skin:    General: Skin is warm and dry.     Comments: 2 small punctures  noted to the right middle finger.  Full range of motion, no active bleeding  Neurological:     Mental Status: She is alert and oriented to person, place, and time.  Psychiatric:        Behavior: Behavior normal.     ED Results / Procedures / Treatments   Labs (all labs ordered are listed, but only abnormal results are displayed) Labs Reviewed - No data to display  EKG None  Radiology No results found.  Procedures Procedures    Medications Ordered in ED Medications  amoxicillin-clavulanate (AUGMENTIN) 875-125 MG per tablet 1 tablet (has no administration in time range)  ibuprofen (ADVIL) tablet 800 mg (800 mg Oral Given 09/26/22 1951)    ED Course/ Medical Decision Making/ A&P                             Medical Decision Making Risk Prescription drug management.   Patient with dog bite to the middle finger.  Will give her dose of Augmentin.  These do not need to be repaired.  They have been cleaned thoroughly.  Follow-up with PCP.  Discussed outpatient follow-up and return precautions        Final Clinical Impression(s) / ED Diagnoses Final diagnoses:  Dog bite, initial encounter    Rx / DC Orders ED Discharge Orders     None         Arthor Captain, PA-C 09/26/22 2353    Vanetta Mulders, MD 09/28/22 1406

## 2022-09-26 NOTE — ED Triage Notes (Signed)
Pt had last tetanus shot in 2018

## 2022-09-26 NOTE — ED Triage Notes (Addendum)
Pt c/o dog bite to right middle finger. Dog is fully vaccinated. Pt's last tetanus shot is out of date and needs to be updated. Wound is bandaged on arrival and bleeding is controlled. Pt had x-rays at [facility] and the provider already called in rx for amoxicillin.

## 2022-09-26 NOTE — Discharge Instructions (Addendum)
Contact a health care provider if: ?You received a tetanus shot and you have swelling, severe pain, redness, or bleeding at the injection site. ?Your pain is not controlled with medicine. ?You have any of these signs of infection: ?More redness, swelling, or pain around your wound. ?Fluid or blood coming from your wound. ?Warmth coming from your wound. ?Pus or a bad smell coming from your wound. ?A fever. ?You notice something coming out of the wound, such as wood or glass. ?You notice a change in the color of your skin near your wound. ?You develop a new rash. ?You need to change the dressing often. ?You develop numbness around your wound. ?Get help right away if: ?Your pain suddenly increases and is severe. ?You develop severe swelling around the wound. ?The wound is on your hand or foot, and you cannot properly move a finger or toe. ?The wound is on your hand or foot, and you notice that your fingers or toes look pale or bluish. ?You have a red streak going away from your wound. ?You develop painful lumps near the wound or on skin anywhere else on your body. ?

## 2022-09-27 NOTE — ED Notes (Signed)
Pt called requesting a RX for amoxicillin for dog bite due to Urgent Care d/c her Rx. EDP aware.  09/27/2022 @1125 

## 2022-09-27 NOTE — ED Notes (Signed)
Nurse called  Pt to verify which pharmacy she wanted RX sent to. Pt verbalized she had called Urgent Care and they were resending her RX. EDP aware. 09/27/2022 @1129 

## 2022-09-29 ENCOUNTER — Telehealth: Payer: Self-pay

## 2022-09-29 NOTE — Transitions of Care (Post Inpatient/ED Visit) (Signed)
   09/29/2022  Name: Jessica Whitaker MRN: [id number] DOB: Aug 11, 1965  Today's TOC FU Call Status: Today's TOC FU Call Status:: Unsuccessul Call (1st Attempt) Unsuccessful Call (1st Attempt) Date: 09/29/22  Attempted to reach the patient regarding the most recent Inpatient/ED visit.  Follow Up Plan: Additional outreach attempts will be made to reach the patient to complete the Transitions of Care (Post Inpatient/ED visit) call.   Signature YL,RMA
